# Patient Record
Sex: Male | Born: 1950 | Race: White | Hispanic: No | Marital: Married | Smoking: Current every day smoker
Health system: Southern US, Community
[De-identification: ages and names within clinical notes are randomized; demographics above are authoritative.]

## PROBLEM LIST (undated history)

## (undated) DIAGNOSIS — N189 Chronic kidney disease, unspecified: Secondary | ICD-10-CM

## (undated) DIAGNOSIS — I10 Essential (primary) hypertension: Secondary | ICD-10-CM

## (undated) DIAGNOSIS — G8929 Other chronic pain: Secondary | ICD-10-CM

---

## 2005-12-15 ENCOUNTER — Emergency Department: Payer: Self-pay | Admitting: Unknown Physician Specialty

## 2007-08-31 ENCOUNTER — Ambulatory Visit: Payer: Self-pay | Admitting: General Practice

## 2008-12-12 ENCOUNTER — Emergency Department: Payer: Self-pay | Admitting: Emergency Medicine

## 2009-03-05 DIAGNOSIS — Z0289 Encounter for other administrative examinations: Secondary | ICD-10-CM | POA: Insufficient documentation

## 2009-06-25 ENCOUNTER — Ambulatory Visit: Payer: Self-pay

## 2015-07-26 ENCOUNTER — Emergency Department
Admission: EM | Admit: 2015-07-26 | Discharge: 2015-07-26 | Disposition: A | Discharge status: Home / Self Care | Payer: Medicare Other | Attending: Emergency Medicine | Admitting: Emergency Medicine

## 2015-07-26 ENCOUNTER — Encounter: Payer: Self-pay | Admitting: Emergency Medicine

## 2015-07-26 DIAGNOSIS — F17200 Nicotine dependence, unspecified, uncomplicated: Secondary | ICD-10-CM | POA: Diagnosis not present

## 2015-07-26 DIAGNOSIS — Y92410 Unspecified street and highway as the place of occurrence of the external cause: Secondary | ICD-10-CM | POA: Insufficient documentation

## 2015-07-26 DIAGNOSIS — Y9389 Activity, other specified: Secondary | ICD-10-CM | POA: Insufficient documentation

## 2015-07-26 DIAGNOSIS — Y999 Unspecified external cause status: Secondary | ICD-10-CM | POA: Diagnosis not present

## 2015-07-26 DIAGNOSIS — S46912A Strain of unspecified muscle, fascia and tendon at shoulder and upper arm level, left arm, initial encounter: Secondary | ICD-10-CM

## 2015-07-26 DIAGNOSIS — M25512 Pain in left shoulder: Secondary | ICD-10-CM | POA: Diagnosis present
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
## 2018-02-17 ENCOUNTER — Ambulatory Visit
Admission: RE | Admit: 2018-02-17 | Discharge: 2018-02-17 | Disposition: A | Discharge status: Home / Self Care | Payer: Medicare Other | Source: Ambulatory Visit | Attending: Family Medicine | Admitting: Family Medicine

## 2018-02-17 ENCOUNTER — Other Ambulatory Visit: Payer: Self-pay | Admitting: Family Medicine

## 2018-02-17 DIAGNOSIS — M5137 Other intervertebral disc degeneration, lumbosacral region: Secondary | ICD-10-CM | POA: Diagnosis not present

## 2018-02-17 DIAGNOSIS — M545 Low back pain, unspecified: Secondary | ICD-10-CM

## 2018-02-17 DIAGNOSIS — M47817 Spondylosis without myelopathy or radiculopathy, lumbosacral region: Secondary | ICD-10-CM | POA: Diagnosis not present

## 2018-02-17 DIAGNOSIS — M1712 Unilateral primary osteoarthritis, left knee: Secondary | ICD-10-CM | POA: Diagnosis not present

## 2018-02-17 DIAGNOSIS — M5136 Other intervertebral disc degeneration, lumbar region: Secondary | ICD-10-CM | POA: Diagnosis not present

## 2018-02-17 DIAGNOSIS — M25562 Pain in left knee: Secondary | ICD-10-CM

## 2018-09-16 ENCOUNTER — Other Ambulatory Visit: Payer: Self-pay | Admitting: Pain Medicine

## 2018-09-16 DIAGNOSIS — M545 Low back pain, unspecified: Secondary | ICD-10-CM

## 2018-09-16 DIAGNOSIS — M79606 Pain in leg, unspecified: Secondary | ICD-10-CM

## 2018-09-19 ENCOUNTER — Other Ambulatory Visit: Payer: Self-pay | Admitting: Pain Medicine

## 2018-09-19 DIAGNOSIS — Z77018 Contact with and (suspected) exposure to other hazardous metals: Secondary | ICD-10-CM

## 2018-10-10 ENCOUNTER — Ambulatory Visit
Admission: RE | Admit: 2018-10-10 | Discharge: 2018-10-10 | Disposition: A | Discharge status: Home / Self Care | Payer: Medicare Other | Source: Ambulatory Visit | Attending: Pain Medicine | Admitting: Pain Medicine

## 2018-10-10 ENCOUNTER — Other Ambulatory Visit: Payer: Self-pay

## 2018-10-10 DIAGNOSIS — M545 Low back pain, unspecified: Secondary | ICD-10-CM

## 2018-10-10 DIAGNOSIS — Z77018 Contact with and (suspected) exposure to other hazardous metals: Secondary | ICD-10-CM

## 2018-10-10 DIAGNOSIS — M79606 Pain in leg, unspecified: Secondary | ICD-10-CM

## 2018-12-15 DIAGNOSIS — G894 Chronic pain syndrome: Secondary | ICD-10-CM

## 2018-12-15 DIAGNOSIS — G8929 Other chronic pain: Secondary | ICD-10-CM | POA: Diagnosis present

## 2018-12-15 DIAGNOSIS — M5430 Sciatica, unspecified side: Secondary | ICD-10-CM | POA: Insufficient documentation

## 2018-12-15 DIAGNOSIS — F419 Anxiety disorder, unspecified: Secondary | ICD-10-CM | POA: Insufficient documentation

## 2018-12-15 DIAGNOSIS — I10 Essential (primary) hypertension: Secondary | ICD-10-CM

## 2019-04-17 DIAGNOSIS — Z9181 History of falling: Secondary | ICD-10-CM | POA: Insufficient documentation

## 2019-12-07 DIAGNOSIS — M069 Rheumatoid arthritis, unspecified: Secondary | ICD-10-CM | POA: Insufficient documentation

## 2019-12-07 DIAGNOSIS — N179 Acute kidney failure, unspecified: Secondary | ICD-10-CM | POA: Insufficient documentation

## 2019-12-10 DIAGNOSIS — R0902 Hypoxemia: Secondary | ICD-10-CM | POA: Insufficient documentation

## 2020-02-12 ENCOUNTER — Inpatient Hospital Stay
Admission: EM | Admit: 2020-02-12 | Discharge: 2020-02-14 | DRG: 682 | Disposition: A | Discharge status: Hospice | Payer: Medicare Other | Attending: Internal Medicine | Admitting: Internal Medicine

## 2020-02-12 ENCOUNTER — Other Ambulatory Visit: Payer: Self-pay

## 2020-02-12 DIAGNOSIS — D539 Nutritional anemia, unspecified: Secondary | ICD-10-CM | POA: Diagnosis present

## 2020-02-12 DIAGNOSIS — F17210 Nicotine dependence, cigarettes, uncomplicated: Secondary | ICD-10-CM | POA: Diagnosis present

## 2020-02-12 DIAGNOSIS — E785 Hyperlipidemia, unspecified: Secondary | ICD-10-CM

## 2020-02-12 DIAGNOSIS — N2581 Secondary hyperparathyroidism of renal origin: Secondary | ICD-10-CM | POA: Diagnosis present

## 2020-02-12 DIAGNOSIS — Z85828 Personal history of other malignant neoplasm of skin: Secondary | ICD-10-CM | POA: Diagnosis not present

## 2020-02-12 DIAGNOSIS — F32A Depression, unspecified: Secondary | ICD-10-CM | POA: Diagnosis present

## 2020-02-12 DIAGNOSIS — Z992 Dependence on renal dialysis: Secondary | ICD-10-CM | POA: Diagnosis not present

## 2020-02-12 DIAGNOSIS — N179 Acute kidney failure, unspecified: Secondary | ICD-10-CM | POA: Diagnosis present

## 2020-02-12 DIAGNOSIS — Z20822 Contact with and (suspected) exposure to covid-19: Secondary | ICD-10-CM | POA: Diagnosis present

## 2020-02-12 DIAGNOSIS — I120 Hypertensive chronic kidney disease with stage 5 chronic kidney disease or end stage renal disease: Principal | ICD-10-CM | POA: Diagnosis present

## 2020-02-12 DIAGNOSIS — M069 Rheumatoid arthritis, unspecified: Secondary | ICD-10-CM | POA: Diagnosis present

## 2020-02-12 DIAGNOSIS — I10 Essential (primary) hypertension: Secondary | ICD-10-CM

## 2020-02-12 DIAGNOSIS — Z7982 Long term (current) use of aspirin: Secondary | ICD-10-CM

## 2020-02-12 DIAGNOSIS — J9691 Respiratory failure, unspecified with hypoxia: Secondary | ICD-10-CM | POA: Diagnosis present

## 2020-02-12 DIAGNOSIS — K922 Gastrointestinal hemorrhage, unspecified: Secondary | ICD-10-CM | POA: Diagnosis present

## 2020-02-12 DIAGNOSIS — Z79899 Other long term (current) drug therapy: Secondary | ICD-10-CM

## 2020-02-12 DIAGNOSIS — D631 Anemia in chronic kidney disease: Secondary | ICD-10-CM | POA: Diagnosis present

## 2020-02-12 DIAGNOSIS — M5430 Sciatica, unspecified side: Secondary | ICD-10-CM | POA: Diagnosis present

## 2020-02-12 DIAGNOSIS — N186 End stage renal disease: Secondary | ICD-10-CM

## 2020-02-12 DIAGNOSIS — E872 Acidosis: Secondary | ICD-10-CM | POA: Diagnosis present

## 2020-02-12 DIAGNOSIS — Z515 Encounter for palliative care: Secondary | ICD-10-CM | POA: Diagnosis not present

## 2020-02-12 DIAGNOSIS — F419 Anxiety disorder, unspecified: Secondary | ICD-10-CM | POA: Diagnosis present

## 2020-02-12 DIAGNOSIS — E7849 Other hyperlipidemia: Secondary | ICD-10-CM | POA: Diagnosis present

## 2020-02-12 DIAGNOSIS — D649 Anemia, unspecified: Secondary | ICD-10-CM | POA: Diagnosis not present

## 2020-02-12 DIAGNOSIS — N189 Chronic kidney disease, unspecified: Secondary | ICD-10-CM

## 2020-02-12 LAB — CBC
HCT: 23.7 % — ABNORMAL LOW (ref 39.0–52.0)
Hemoglobin: 7.5 g/dL — ABNORMAL LOW (ref 13.0–17.0)
MCH: 31.8 pg (ref 26.0–34.0)
MCHC: 31.6 g/dL (ref 30.0–36.0)
MCV: 100.4 fL — ABNORMAL HIGH (ref 80.0–100.0)
Platelets: 298 10*3/uL (ref 150–400)
RBC: 2.36 MIL/uL — ABNORMAL LOW (ref 4.22–5.81)
RDW: 13.7 % (ref 11.5–15.5)
WBC: 7.3 10*3/uL (ref 4.0–10.5)
nRBC: 0 % (ref 0.0–0.2)

## 2020-02-12 LAB — RESPIRATORY PANEL BY RT PCR (FLU A&B, COVID)
Influenza A by PCR: NEGATIVE
Influenza B by PCR: NEGATIVE
SARS Coronavirus 2 by RT PCR: NEGATIVE

## 2020-02-12 LAB — BASIC METABOLIC PANEL
Anion gap: 9 (ref 5–15)
BUN: 28 mg/dL — ABNORMAL HIGH (ref 8–23)
CO2: 20 mmol/L — ABNORMAL LOW (ref 22–32)
Calcium: 8.9 mg/dL (ref 8.9–10.3)
Chloride: 108 mmol/L (ref 98–111)
Creatinine, Ser: 3.53 mg/dL — ABNORMAL HIGH (ref 0.61–1.24)
GFR, Estimated: 18 mL/min — ABNORMAL LOW (ref >60)
Glucose, Bld: 138 mg/dL — ABNORMAL HIGH (ref 70–99)
Potassium: 4.6 mmol/L (ref 3.5–5.1)
Sodium: 137 mmol/L (ref 135–145)

## 2020-02-12 LAB — PREPARE RBC (CROSSMATCH)

## 2020-02-12 LAB — ABO/RH: ABO/RH(D): A NEG

## 2020-02-12 MED ORDER — TRAZODONE HCL 50 MG PO TABS: 25.0000 mg | ORAL_TABLET | Freq: Every evening | ORAL | Status: DC | PRN

## 2020-02-12 MED ORDER — ONDANSETRON HCL 4 MG PO TABS: 4.0000 mg | ORAL_TABLET | Freq: Four times a day (QID) | ORAL | Status: DC | PRN

## 2020-02-12 MED ORDER — ENOXAPARIN SODIUM 40 MG/0.4ML ~~LOC~~ SOLN: 40.0000 mg | SUBCUTANEOUS | Status: DC

## 2020-02-12 MED ORDER — SODIUM CHLORIDE 0.9 % IV SOLN: INTRAVENOUS | Status: DC

## 2020-02-12 MED ORDER — SODIUM CHLORIDE 0.9 % IV SOLN
10.0000 mL/h | Freq: Once | INTRAVENOUS | Status: AC
  Administered 2020-02-12: 10 mL/h via INTRAVENOUS

## 2020-02-12 MED ORDER — MORPHINE SULFATE (PF) 2 MG/ML IV SOLN
2.0000 mg | INTRAVENOUS | Status: DC | PRN
  Administered 2020-02-13: 04:00:00 2 mg via INTRAVENOUS
  Filled 2020-02-12: qty 1

## 2020-02-12 MED ORDER — ONDANSETRON HCL 4 MG/2ML IJ SOLN: 4.0000 mg | Freq: Four times a day (QID) | INTRAMUSCULAR | Status: DC | PRN

## 2020-02-12 MED ORDER — SODIUM CHLORIDE 0.9 % IV SOLN: 2.0000 g | Freq: Once | INTRAVENOUS | Status: DC

## 2020-02-12 MED ORDER — MAGNESIUM HYDROXIDE 400 MG/5ML PO SUSP
30.0000 mL | Freq: Every day | ORAL | Status: DC | PRN
  Filled 2020-02-12: qty 30

## 2020-02-12 MED ORDER — VANCOMYCIN HCL IN DEXTROSE 1-5 GM/200ML-% IV SOLN: 1000.0000 mg | Freq: Once | INTRAVENOUS | Status: DC

## 2020-02-12 NOTE — ED Provider Notes (Signed)
[facility] Emergency Department Provider Note   ____________________________________________   First MD Initiated Contact with Patient 02/12/20 2000     (approximate)  I have reviewed the triage vital signs and the nursing notes.   HISTORY  Chief Complaint Low Hgb   HPI Jerry Galloway is a 69 y.o. male who was sent here by his doctor because his blood count was low.  In September patient was in [facility] getting dialysis.  He was told by [facility] he would live for 2 weeks.  He is still going strong.  His GFR is 18.  His electrolytes are okay.  His hemoglobin however 7.5.  Patient denies having anything wrong with his rectum and denies have any black tarry stools or blood in his stools but his daughter says that the people do his laundry every repeatedly seen blood in his underwear in the laundry.         History reviewed. No pertinent past medical history. Past medical history is positive for renal failure which was acute in September He also had hypoxic respiratory failure. He also appears to have rectal bleeding. There are no problems to display for this patient.   History reviewed. No pertinent surgical history.  Prior to Admission medications   Not on File    Allergies Patient has no known allergies.  No family history on file.  Social History Social History   Tobacco Use  . Smoking status: Current Every Day Smoker  . Smokeless tobacco: Never Used  Substance Use Topics  . Alcohol use: Never  . Drug use: Never    Review of Systems  Constitutional: No fever/chills Eyes: No visual changes. ENT: No sore throat. Cardiovascular: Denies chest pain. Respiratory: Denies shortness of breath. Gastrointestinal: No abdominal pain.  No nausea, no vomiting.  No diarrhea.  No constipation. Genitourinary: Negative for dysuria. Musculoskeletal: Negative for back pain. Skin: Negative for rash. Neurological: Negative for headaches, focal weakness    ____________________________________________   PHYSICAL EXAM:  VITAL SIGNS: ED Triage Vitals  Enc Vitals Group     BP 02/12/20 1520 (!) 131/49     Pulse Rate 02/12/20 1520 79     Resp 02/12/20 1520 18     Temp 02/12/20 1520 98 F (36.7 C)     Temp Source 02/12/20 1955 Oral     SpO2 02/12/20 1520 100 %     Weight 02/12/20 1518 160 lb (72.6 kg)     Height 02/12/20 1518 6' (1.829 m)     Head Circumference --      Peak Flow --      Pain Score 02/12/20 1517 0     Pain Loc --      Pain Edu? --      Excl. in [place]? --     Constitutional: Alert and oriented. Well appearing and in no acute distress. Eyes: Conjunctivae are normal. PER Head: Atraumatic. Nose: No congestion/rhinnorhea. Mouth/Throat: Mucous membranes are moist.  Oropharynx non-erythematous. Neck: No stridor.   Cardiovascular: Normal rate, regular rhythm. Grossly normal heart sounds.  Good peripheral circulation. Respiratory: Normal respiratory effort.  No retractions. Lungs CTAB. Gastrointestinal: Soft and nontender. No distention. No abdominal bruits. No CVA tenderness. Rectal: Patient refused Musculoskeletal: No lower extremity tenderness bilateral 1+ edema.   Neurologic:  Normal speech and language. No gross focal neurologic deficits are appreciated. Skin:  Skin is warm, dry and intact. No rash noted.  ____________________________________________   LABS (all labs ordered are listed, but only abnormal  results are displayed)  Labs Reviewed  CBC - Abnormal; Notable for the following components:      Result Value   RBC 2.36 (*)    Hemoglobin 7.5 (*)    HCT 23.7 (*)    MCV 100.4 (*)    All other components within normal limits  BASIC METABOLIC PANEL - Abnormal; Notable for the following components:   CO2 20 (*)    Glucose, Bld 138 (*)    BUN 28 (*)    Creatinine, Ser 3.53 (*)    GFR, Estimated 18 (*)    All other components within normal limits  TYPE AND SCREEN  PREPARE RBC (CROSSMATCH)    ____________________________________________  EKG   ____________________________________________  RADIOLOGY Gertha Calkin, personally viewed and evaluated these images (plain radiographs) as part of my medical decision making, as well as reviewing the written report by the radiologist.  ED MD interpretation:    Official radiology report(s): No results found.  ____________________________________________   PROCEDURES  Procedure(s) performed (including Critical Care):  Procedures   ____________________________________________   INITIAL IMPRESSION / ASSESSMENT AND PLAN / ED COURSE               ____________________________________________   FINAL CLINICAL IMPRESSION(S) / ED DIAGNOSES  Final diagnoses:  Symptomatic anemia  Gastrointestinal hemorrhage, unspecified gastrointestinal hemorrhage type  Chronic renal failure, unspecified CKD stage     ED Discharge Orders    None      *Please note:  Jerry Galloway was evaluated in Emergency Department on 02/12/2020 for the symptoms described in the history of present illness. He was evaluated in the context of the global COVID-19 pandemic, which necessitated consideration that the patient might be at risk for infection with the SARS-CoV-2 virus that causes COVID-19. Institutional protocols and algorithms that pertain to the evaluation of patients at risk for COVID-19 are in a state of rapid change based on information released by regulatory bodies including the CDC and federal and state organizations. These policies and algorithms were followed during the patient's care in the ED.  Some ED evaluations and interventions may be delayed as a result of limited staffing during and the pandemic.*   Note:  This document was prepared using Dragon voice recognition software and may include unintentional dictation errors.    Nena Polio, MD 02/12/20 [phone]
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
## 2020-02-12 NOTE — ED Triage Notes (Signed)
Pt comes via POV from MD with c/o needing blood transfusion. Pt states no issues and says he was sent by doctor to get a transfusion or get his blood checked.  Pt denies any pain. Pt denies any CP or SOB.

## 2020-02-13 DIAGNOSIS — E7849 Other hyperlipidemia: Secondary | ICD-10-CM

## 2020-02-13 DIAGNOSIS — D649 Anemia, unspecified: Secondary | ICD-10-CM

## 2020-02-13 DIAGNOSIS — N186 End stage renal disease: Secondary | ICD-10-CM

## 2020-02-13 DIAGNOSIS — E785 Hyperlipidemia, unspecified: Secondary | ICD-10-CM

## 2020-02-13 LAB — BPAM RBC
Blood Product Expiration Date: 202111252359
ISSUE DATE / TIME: 202111082212
Unit Type and Rh: 600

## 2020-02-13 LAB — BASIC METABOLIC PANEL
Anion gap: 9 (ref 5–15)
BUN: 23 mg/dL (ref 8–23)
CO2: 18 mmol/L — ABNORMAL LOW (ref 22–32)
Calcium: 8.5 mg/dL — ABNORMAL LOW (ref 8.9–10.3)
Chloride: 111 mmol/L (ref 98–111)
Creatinine, Ser: 3.11 mg/dL — ABNORMAL HIGH (ref 0.61–1.24)
GFR, Estimated: 21 mL/min — ABNORMAL LOW (ref >60)
Glucose, Bld: 115 mg/dL — ABNORMAL HIGH (ref 70–99)
Potassium: 4.3 mmol/L (ref 3.5–5.1)
Sodium: 138 mmol/L (ref 135–145)

## 2020-02-13 LAB — TYPE AND SCREEN
ABO/RH(D): A NEG
Antibody Screen: NEGATIVE
Unit division: 0

## 2020-02-13 LAB — HEMOGLOBIN AND HEMATOCRIT, BLOOD
HCT: 25.3 % — ABNORMAL LOW (ref 39.0–52.0)
HCT: 25.6 % — ABNORMAL LOW (ref 39.0–52.0)
HCT: 27.2 % — ABNORMAL LOW (ref 39.0–52.0)
Hemoglobin: 8.2 g/dL — ABNORMAL LOW (ref 13.0–17.0)
Hemoglobin: 8.4 g/dL — ABNORMAL LOW (ref 13.0–17.0)
Hemoglobin: 8.9 g/dL — ABNORMAL LOW (ref 13.0–17.0)

## 2020-02-13 LAB — CBC
HCT: 26.2 % — ABNORMAL LOW (ref 39.0–52.0)
Hemoglobin: 8.3 g/dL — ABNORMAL LOW (ref 13.0–17.0)
MCH: 30.6 pg (ref 26.0–34.0)
MCHC: 31.7 g/dL (ref 30.0–36.0)
MCV: 96.7 fL (ref 80.0–100.0)
Platelets: 279 10*3/uL (ref 150–400)
RBC: 2.71 MIL/uL — ABNORMAL LOW (ref 4.22–5.81)
RDW: 14.7 % (ref 11.5–15.5)
WBC: 7.8 10*3/uL (ref 4.0–10.5)
nRBC: 0 % (ref 0.0–0.2)

## 2020-02-13 LAB — IRON AND TIBC
Iron: 50 ug/dL (ref 45–182)
Saturation Ratios: 18 % (ref 17.9–39.5)
TIBC: 272 ug/dL (ref 250–450)
UIBC: 222 ug/dL

## 2020-02-13 LAB — VITAMIN B12: Vitamin B-12: 184 pg/mL (ref 180–914)

## 2020-02-13 LAB — FERRITIN: Ferritin: 212 ng/mL (ref 24–336)

## 2020-02-13 MED ORDER — NICOTINE 21 MG/24HR TD PT24
21.0000 mg | MEDICATED_PATCH | Freq: Every day | TRANSDERMAL | Status: DC
  Administered 2020-02-13: 21:00:00 21 mg via TRANSDERMAL
  Filled 2020-02-13: qty 1

## 2020-02-13 MED ORDER — OXYCODONE-ACETAMINOPHEN 7.5-325 MG PO TABS
2.0000 | ORAL_TABLET | Freq: Four times a day (QID) | ORAL | Status: DC | PRN
  Administered 2020-02-13 – 2020-02-14 (×3): 2 via ORAL
  Filled 2020-02-13 (×3): qty 2

## 2020-02-13 MED ORDER — CALCITRIOL 0.25 MCG PO CAPS
0.2500 ug | ORAL_CAPSULE | Freq: Every day | ORAL | Status: DC
  Administered 2020-02-14: 09:00:00 0.25 ug via ORAL
  Filled 2020-02-13: qty 1

## 2020-02-13 MED ORDER — LABETALOL HCL 5 MG/ML IV SOLN
10.0000 mg | Freq: Three times a day (TID) | INTRAVENOUS | Status: DC | PRN
  Administered 2020-02-13: 15:00:00 10 mg via INTRAVENOUS
  Filled 2020-02-13: qty 4

## 2020-02-13 MED ORDER — LORAZEPAM 0.5 MG PO TABS
0.5000 mg | ORAL_TABLET | Freq: Two times a day (BID) | ORAL | Status: DC | PRN
  Administered 2020-02-13: 0.5 mg via ORAL
  Filled 2020-02-13: qty 1

## 2020-02-13 MED ORDER — ROSUVASTATIN CALCIUM 20 MG PO TABS
40.0000 mg | ORAL_TABLET | Freq: Every day | ORAL | Status: DC
  Administered 2020-02-13 – 2020-02-14 (×2): 40 mg via ORAL
  Filled 2020-02-13 (×2): qty 2

## 2020-02-13 MED ORDER — SODIUM BICARBONATE 650 MG PO TABS
1300.0000 mg | ORAL_TABLET | Freq: Two times a day (BID) | ORAL | Status: DC
  Administered 2020-02-13 – 2020-02-14 (×2): 1300 mg via ORAL
  Filled 2020-02-13 (×3): qty 2

## 2020-02-13 NOTE — Progress Notes (Signed)
PT Cancellation Note  Patient Details Name: Jerry Galloway MRN: [id number] DOB: 02/24/1951   Cancelled Treatment:    Reason Eval/Treat Not Completed: PT screened, no needs identified, will sign off. Patient ambulated without AD and independently with OT this morning. Patient and wife report he gets around fine. No needs at this time. Will sign off.     Jemal Miskell 02/13/2020, 3:01 PM

## 2020-02-13 NOTE — Evaluation (Signed)
Occupational Therapy Evaluation Patient Details Name: Jerry Galloway MRN: [id number] DOB: Dec 21, 1950 Today's Date: 02/13/2020    History of Present Illness Jerry Galloway  is a 69 y.o. male with a known history of hypertension, rheumatoid arthritis, tobacco abuse, and anxiety and depression, who presented to the emergency room with acute onset of anemia being sent in by his primary care physician.   Clinical Impression   Jerry Galloway was seen for OT evaluation this date. Prior to hospital admission, pt was Independent in ADLs and mobility. Pt lives c wife in mobile home c 2-3 STE. Pt presents to acute OT demonstrating baseline Independence with ADL performance and functional mobility. Pt currently requires SUPERVISION only for ~180 ft mobility w/o AD; vitals stable t/o. Pt is HOH and requires increased cues for directions in hallway. Pt has no skilled acute OT needs identified. Will sign off at this time. Please re-consult if new needs arise. Upon hospital discharge, anticipate no OT follow up needed.      Follow Up Recommendations  No OT follow up    Equipment Recommendations  None recommended by OT    Recommendations for Other Services       Precautions / Restrictions Precautions Precautions: None Restrictions Weight Bearing Restrictions: No      Mobility Bed Mobility Overal bed mobility: Independent       Transfers Overall transfer level: Independent         Balance Overall balance assessment: Mild deficits observed, not formally tested       ADL either performed or assessed with clinical judgement   ADL Overall ADL's : Independent                       Pertinent Vitals/Pain Pain Assessment: No/denies pain     Hand Dominance     Extremity/Trunk Assessment Upper Extremity Assessment Upper Extremity Assessment: Overall WFL for tasks assessed   Lower Extremity Assessment Lower Extremity Assessment: Overall WFL for tasks assessed        Communication Communication Communication: HOH   Cognition Arousal/Alertness: Awake/alert Behavior During Therapy: Restless Overall Cognitive Status: Difficult to assess      General Comments       Exercises Exercises: Other exercises Other Exercises Other Exercises: Pt educated re: OT role, DME recs, falls prevention Other Exercises: sit<>stand, sitting/standing balance/tolerance, ~180 ft mobility   Shoulder Instructions      Home Living Family/patient expects to be discharged to:: Private residence Living Arrangements: Spouse/significant other Available Help at Discharge: Family Type of Home: Mobile home Home Access: Stairs to enter Entrance Stairs-Number of Steps: 2-3 Entrance Stairs-Rails:  (endorses 1 rail, does not answer L vs R) Home Layout: One level               Home Equipment: Walker - 2 wheels;Cane - single point          Prior Functioning/Environment Level of Independence: Independent        Comments: Pt reports Independent mobility and ADLs        OT Problem List: Decreased safety awareness      OT Treatment/Interventions:      OT Goals(Current goals can be found in the care plan section) Acute Rehab OT Goals Patient Stated Goal: to go home OT Goal Formulation: With patient Time For Goal Achievement: 02/27/20 Potential to Achieve Goals: Good  OT Frequency:      AM-PAC OT "6 Clicks" Daily Activity     Outcome Measure Help from another  person eating meals?: None Help from another person taking care of personal grooming?: None Help from another person toileting, which includes using toliet, bedpan, or urinal?: None Help from another person bathing (including washing, rinsing, drying)?: None Help from another person to put on and taking off regular upper body clothing?: None Help from another person to put on and taking off regular lower body clothing?: None 6 Click Score: 24   End of Session Nurse Communication: Mobility  status  Activity Tolerance: Patient tolerated treatment well Patient left: in bed;with call bell/phone within reach;with bed alarm set  OT Visit Diagnosis: Other abnormalities of gait and mobility (R26.89)                Time: 3794-3276 OT Time Calculation (min): 15 min Charges:  OT General Charges $OT Visit: 1 Visit OT Evaluation $OT Eval Low Complexity: 1 Low OT Treatments $Therapeutic Activity: 8-22 mins  Dessie Coma, M.S. OTR/L  02/13/20, 11:44 AM  ascom [phone]

## 2020-02-13 NOTE — Progress Notes (Signed)
Daily update given to daughter, states that she wants to speak with MD, MD made aware

## 2020-02-13 NOTE — Consult Note (Signed)
[facility] CONSULT NOTE    Date: 02/13/2020                  Patient Name:  Jerry Galloway  MRN: [id number]  DOB: 07-Jan-1951  Age / Sex: 69 y.o., male         PCP: Sherald Hess., MD                 Service Requesting Consult: Hospitalist                 Reason for Consult: ARF             History of Present Illness: Patient is a 69 y.o. male with  HTN, RA, ARF recently treated at [facility] hospital who was admitted to [facility] on 02/12/2020 for evaluation of anemia.   Medications: Outpatient medications: Medications Prior to Admission  Medication Sig Dispense Refill Last Dose  . aspirin 81 MG EC tablet Take 1 tablet by mouth daily.   02/11/2020 at 0800  . buprenorphine (BUTRANS) 15 MCG/HR 1 patch once a week.   Unknown at Unknown  . ENDOCET 7.5-325 MG tablet Take 1-2 tablets by mouth 4 (four) times daily as needed.   prn at prn  . hydrochlorothiazide (HYDRODIURIL) 25 MG tablet Take 25 mg by mouth daily.   02/11/2020 at Unknown time  . lisinopril (ZESTRIL) 40 MG tablet Take 40 mg by mouth daily.   02/11/2020 at Unknown time  . LORazepam (ATIVAN) 0.5 MG tablet Take 1-2 tablets by mouth at bedtime.    02/11/2020 at Unknown time  . nitroGLYCERIN (NITROSTAT) 0.4 MG SL tablet Place 0.4 mg under the tongue every 5 (five) minutes as needed.    prn at prn  . ondansetron (ZOFRAN-ODT) 8 MG disintegrating tablet Take 8 mg by mouth every 6 (six) hours as needed.    prn at prn  . prochlorperazine (COMPAZINE) 5 MG tablet Take 5 mg by mouth every 6 (six) hours as needed.    prn at prn  . rosuvastatin (CRESTOR) 40 MG tablet Take 40 mg by mouth daily.   02/12/2020 at 0800  . Vitamin D, Ergocalciferol, (DRISDOL) 1.25 MG (50000 UNIT) CAPS capsule Take 50,000 Units by mouth once a week.   Unknown at Unknown  . XTAMPZA ER 13.5 MG C12A Take 1 capsule by mouth 2 (two) times daily.   02/11/2020 at Unknown time    Current medications: Current Facility-Administered Medications   Medication Dose Route Frequency Provider Last Rate Last Admin  . 0.9 %  sodium chloride infusion   Intravenous Continuous Wyvonnia Dusky, MD 75 mL/hr at 02/13/20 1321 Rate Change at 02/13/20 1321  . calcitRIOL (ROCALTROL) capsule 0.25 mcg  0.25 mcg Oral Daily Naleigha Raimondi, MD      . labetalol (NORMODYNE) injection 10 mg  10 mg Intravenous Q8H PRN Wyvonnia Dusky, MD   10 mg at 02/13/20 1457  . LORazepam (ATIVAN) tablet 0.5 mg  0.5 mg Oral BID PRN Wyvonnia Dusky, MD      . magnesium hydroxide (MILK OF MAGNESIA) suspension 30 mL  30 mL Oral Daily PRN Mansy, Jan A, MD      . morphine 2 MG/ML injection 2 mg  2 mg Intravenous Q4H PRN Mansy, Jan A, MD   2 mg at 02/13/20 0334  . ondansetron (ZOFRAN) tablet 4 mg  4 mg Oral Q6H PRN Mansy, Arvella Merles, MD       Or  . ondansetron (  ZOFRAN) injection 4 mg  4 mg Intravenous Q6H PRN Mansy, Jan A, MD      . oxyCODONE-acetaminophen (PERCOCET) 7.5-325 MG per tablet 2 tablet  2 tablet Oral Q6H PRN Wyvonnia Dusky, MD   2 tablet at 02/13/20 1447  . rosuvastatin (CRESTOR) tablet 40 mg  40 mg Oral Daily Wyvonnia Dusky, MD   40 mg at 02/13/20 1326      Allergies: No Known Allergies    Past Medical History: History reviewed. No pertinent past medical history.   Past Surgical History: History reviewed. No pertinent surgical history.   Family History: No family history on file.   Social History: Social History   Socioeconomic History  . Marital status: Married    Spouse name: Not on file  . Number of children: Not on file  . Years of education: Not on file  . Highest education level: Not on file  Occupational History  . Not on file  Tobacco Use  . Smoking status: Current Every Day Smoker  . Smokeless tobacco: Never Used  Substance and Sexual Activity  . Alcohol use: Never  . Drug use: Never  . Sexual activity: Not on file  Other Topics Concern  . Not on file  Social History Narrative  . Not on file   Social  Determinants of Health   Financial Resource Strain:   . Difficulty of Paying Living Expenses: Not on file  Food Insecurity:   . Worried About Charity fundraiser in the Last Year: Not on file  . Ran Out of Food in the Last Year: Not on file  Transportation Needs:   . Lack of Transportation (Medical): Not on file  . Lack of Transportation (Non-Medical): Not on file  Physical Activity:   . Days of Exercise per Week: Not on file  . Minutes of Exercise per Session: Not on file  Stress:   . Feeling of Stress : Not on file  Social Connections:   . Frequency of Communication with Friends and Family: Not on file  . Frequency of Social Gatherings with Friends and Family: Not on file  . Attends Religious Services: Not on file  . Active Member of Clubs or Organizations: Not on file  . Attends Archivist Meetings: Not on file  . Marital Status: Not on file  Intimate Partner Violence:   . Fear of Current or Ex-Partner: Not on file  . Emotionally Abused: Not on file  . Physically Abused: Not on file  . Sexually Abused: Not on file   Gen: + Malaise, fatigue HEENT: very hard of hearing CV: No chest pain or shortness of breath Resp: No cough or sputum production GI: No nausea, vomiting or diarrhea.  No blood in the stool GU : No problems with voiding.  No hematuria.    MS: + reports "pulled muscle" Derm:   No complaints Psych: No complaints Heme: No complaints Neuro: No complaints Endocrine: No complaints    Vital Signs: Blood pressure (!) 180/73, pulse 82, temperature 98.3 F (36.8 C), resp. rate 16, height 6' (1.829 m), weight 72.6 kg, SpO2 98 %.  Weight trends: Filed Weights   02/12/20 1518  Weight: 72.6 kg    Physical Exam: Physical Exam: General:  No acute distress,resting in bed  Head:  Normocephalic, atraumatic. Moist oral mucosal membranes  Eyes:  Anicteric  Lungs:   Clear to auscultation, normal effort  Heart:  S1S2 no rubs or gallops  Abdomen:   Soft,  nontender  Extremities:  No peripheral edema.  Neurologic:  Awake, alert, hard of hearing,speech clear and appropriate   Skin:  No lesions or rashes    Lab results: Basic Metabolic Panel: Recent Labs  Lab 02/12/20 1522 02/13/20 0405  NA 137 138  K 4.6 4.3  CL 108 111  CO2 20* 18*  GLUCOSE 138* 115*  BUN 28* 23  CREATININE 3.53* 3.11*  CALCIUM 8.9 8.5*    Liver Function Tests: No results for input(s): AST, ALT, ALKPHOS, BILITOT, PROT, ALBUMIN in the last 168 hours. No results for input(s): LIPASE, AMYLASE in the last 168 hours. No results for input(s): AMMONIA in the last 168 hours.  CBC: Recent Labs  Lab 02/12/20 1522 02/13/20 0405 02/13/20 0924  WBC 7.3 7.8  --   HGB 7.5* 8.3* 8.9*  HCT 23.7* 26.2* 27.2*  MCV 100.4* 96.7  --   PLT 298 279  --     Cardiac Enzymes: No results for input(s): CKTOTAL, CKMB, CKMBINDEX, TROPONINI in the last 168 hours.  BNP: Invalid input(s): POCBNP  CBG: No results for input(s): GLUCAP in the last 168 hours.  Microbiology: Results for orders placed or performed during the hospital encounter of 02/12/20  Respiratory Panel by RT PCR (Flu A&B, Covid) - Nasopharyngeal Swab     Status: None   Collection Time: 02/12/20  9:09 PM   Specimen: Nasopharyngeal Swab  Result Value Ref Range Status   SARS Coronavirus 2 by RT PCR NEGATIVE NEGATIVE Final    Comment: (NOTE) SARS-CoV-2 target nucleic acids are NOT DETECTED.  The SARS-CoV-2 RNA is generally detectable in upper respiratoy specimens during the acute phase of infection. The lowest concentration of SARS-CoV-2 viral copies this assay can detect is 131 copies/mL. A negative result does not preclude SARS-Cov-2 infection and should not be used as the sole basis for treatment or other patient management decisions. A negative result may occur with  improper specimen collection/handling, submission of specimen other than nasopharyngeal swab, presence of viral mutation(s) within  the areas targeted by this assay, and inadequate number of viral copies (<131 copies/mL). A negative result must be combined with clinical observations, patient history, and epidemiological information. The expected result is Negative.  Fact Sheet for Patients:  PinkCheek.be  Fact Sheet for Healthcare Providers:  GravelBags.it  This test is no t yet approved or cleared by the [place] FDA and  has been authorized for detection and/or diagnosis of SARS-CoV-2 by FDA under an Emergency Use Authorization (EUA). This EUA will remain  in effect (meaning this test can be used) for the duration of the COVID-19 declaration under Section 564(b)(1) of the Act, 21 U.S.C. section 360bbb-3(b)(1), unless the authorization is terminated or revoked sooner.     Influenza A by PCR NEGATIVE NEGATIVE Final   Influenza B by PCR NEGATIVE NEGATIVE Final    Comment: (NOTE) The Xpert Xpress SARS-CoV-2/FLU/RSV assay is intended as an aid in  the diagnosis of influenza from Nasopharyngeal swab specimens and  should not be used as a sole basis for treatment. Nasal washings and  aspirates are unacceptable for Xpert Xpress SARS-CoV-2/FLU/RSV  testing.  Fact Sheet for Patients: PinkCheek.be  Fact Sheet for Healthcare Providers: GravelBags.it  This test is not yet approved or cleared by the [place] FDA and  has been authorized for detection and/or diagnosis of SARS-CoV-2 by  FDA under an Emergency Use Authorization (EUA). This EUA will remain  in effect (meaning this test can be used) for the duration of the  Covid-19 declaration under Section 564(b)(1) of the Act, 21  U.S.C. section 360bbb-3(b)(1), unless the authorization is  terminated or revoked. Performed at [facility], [address] [place], [place] [address]     Coagulation Studies: No results for input(s):  LABPROT, INR in the last 72 hours.  Urinalysis: No results for input(s): COLORURINE, LABSPEC, PHURINE, GLUCOSEU, HGBUR, BILIRUBINUR, KETONESUR, PROTEINUR, UROBILINOGEN, NITRITE, LEUKOCYTESUR in the last 72 hours.  Invalid input(s): APPERANCEUR    Imaging: No results found.  EXAM: US RENAL COMPLETE  DATE: 12/07/2019 2:26 PM  ACCESSION: [id number]  DICTATED: 12/07/2019 2:28 PM  INTERPRETATION LOCATION: [place]   CLINICAL INDICATION: 69 years old Male with acute renal failure    COMPARISON: None.   TECHNIQUE: Static andcine images of the kidneys and bladder were performed.   FINDINGS:   KIDNEYS: The kidneys are normal in size with increased echogenicity bilaterally. No solid masses or calculi. No hydronephrosis. Right renal cyst is better appreciated on same-day CT.        Assessment/ Plan:  69 y.o. male with  HTN, Rheumatoid Arthritis,ARF recently treated at [facility], was admitted to [facility] on 02/12/2020 with anemia requiring blood transfusions was admitted on 02/12/2020 for anemia requiring PRBC transfusions. He has h/o HTN , RA and recent ARF treated at [facility].  Active Problems:   'light-for-dates' infant with signs of fetal malnutrition   Symptomatic anemia   ESRD (end stage renal disease) ([place])   Other hyperlipidemia  'light-for-dates' infant with signs of fetal malnutrition [P05.00] Symptomatic anemia [D64.9] Gastrointestinal hemorrhage, unspecified gastrointestinal hemorrhage type [K92.2] Chronic renal failure, unspecified CKD stage [N18.9]  #. Acute Renal failure  Patient was admitted to [facility] with acute renal failure in September,Renal function improved since then. Baseline creatinine  1.5 (jan 2021) per [facility] notes  Renal imaging as above - no hydronephrosis Right renal cyst noted U/A: not available. Will order Work up at [facility] 12/16/2019: ANCA positive, MPO and PR3 neg; Anti GBM neg, ANA neg, ENA neg,  PLA2R ab neg HIV neg SPEP neg [facility] U/A = +  protein, gluc, blood No iv contrast exposure NSAID ?  Renal labs on admission to [facility] in September 2021 Creatinine 13 and K+ 6.5 Patient underwent 7 dialysis sessions Refused further dialysis and discharged from [facility] on 12/16/2019 with home hospice  Renal function today Creatinine 3.11 BUN 23 GFR 21 Renal function improved since last admission to [facility] No acute indication for dialysis We can follow up as outpatient   Plan: Obtain U/A Electrolytes and Volume status are acceptable No acute indication for Dialysis at present   #. Anemia of CKD  Lab Results  Component Value Date   HGB 8.9 (L) 02/13/2020  GI consult in process  Received PRBC transfusions   #Hypertension Blood pressure readings above goal Patient is on Labetalol as needed  # Secondary hyperparathyroidism of renal origin N 25.81  PTH 658 on 12/08/2019 Add calcitriol   # metabolic acidosis secondary to CKD Add Sodium bicarbonate supplentation     LOS: [place] 11/9/20213:58 PM  [place], [place]

## 2020-02-13 NOTE — Progress Notes (Signed)
PROGRESS NOTE    Jerry Galloway  PZW:[id number] DOB: [birth year] DOA: 02/12/2020 PCP: Sherald Hess., MD   Assessment & Plan:   Active Problems:   'light-for-dates' infant with signs of fetal malnutrition   Symptomatic anemia   Symptomatic anemia: likely blood loss due to GI bleeding. S/p 2 units pRBCs transfused. Will continue to monitor H&H. Continue on PPI. GI consulted   ESRD: previously on HD but pt did not want to live on HD. Nephro consulted. Evidently palliative care/hospice was seeing the pt prior.  Anxiety: severity unknown. Continue on home dose of ativan prn   Hypertension: continue to hold HCTZ, lisinopril. IV labetalol prn   HLD: continue on statin   Tobacco abuse: nicotine patch to prevent w/drawal. Smoking cessation counseling    DVT prophylaxis: SCDs Code Status: full Family Communication: discussed pt's care w/ pt's daughter, Joelene Millin and answered her questions  Disposition Plan: depends on what care pt wants to receive and what he does not. Likely d/c back home   Status is: Inpatient  Remains inpatient appropriate because:Ongoing diagnostic testing needed not appropriate for outpatient work up, Unsafe d/c plan and IV treatments appropriate due to intensity of illness or inability to take PO   Dispo: The patient is from: Home              Anticipated d/c is to: Home              Anticipated d/c date is: 3 days              Patient currently is not medically stable to d/c.    Consultants:   nephro   GI   Procedures:  Antimicrobials:   Subjective: Pt c/o malaise   Objective: Vitals:   02/12/20 2132 02/12/20 2223 02/13/20 0005 02/13/20 0518  BP: (!) 155/78 (!) 159/57 (!) 155/61 (!) 164/77  Pulse: 89 85 69 91  Resp: 17 17 17 17   Temp: 97.9 F (36.6 C) 98.1 F (36.7 C) 98.2 F (36.8 C) 98.4 F (36.9 C)  TempSrc: Oral Oral Oral   SpO2: 97% 100% 98% 99%  Weight:      Height:        Intake/Output Summary (Last 24 hours) at  02/13/2020 4235 Last data filed at 02/13/2020 0023 Gross per 24 hour  Intake 1700 ml  Output --  Net 1700 ml   Filed Weights   02/12/20 1518  Weight: 72.6 kg    Examination:  General exam: Appears calm and comfortable  Respiratory system: Clear to auscultation. Respiratory effort normal. Cardiovascular system: S1 & S2 +. No  rubs, gallops or clicks.  Gastrointestinal system: Abdomen is nondistended, soft and nontender.  Normal bowel sounds heard. Central nervous system: Alert and oriented. Moves all 4 extremities Psychiatry: Judgement and insight appear normal.    Data Reviewed: I have personally reviewed following labs and imaging studies  CBC: Recent Labs  Lab 02/12/20 0028 02/12/20 1522 02/13/20 0405  WBC  --  7.3 7.8  HGB 8.2* 7.5* 8.3*  HCT 25.3* 23.7* 26.2*  MCV  --  100.4* 96.7  PLT  --  298 361   Basic Metabolic Panel: Recent Labs  Lab 02/12/20 1522 02/13/20 0405  NA 137 138  K 4.6 4.3  CL 108 111  CO2 20* 18*  GLUCOSE 138* 115*  BUN 28* 23  CREATININE 3.53* 3.11*  CALCIUM 8.9 8.5*   GFR: Estimated Creatinine Clearance: 22.4 mL/min (A) (by C-G formula based on SCr  of 3.11 mg/dL (H)). Liver Function Tests: No results for input(s): AST, ALT, ALKPHOS, BILITOT, PROT, ALBUMIN in the last 168 hours. No results for input(s): LIPASE, AMYLASE in the last 168 hours. No results for input(s): AMMONIA in the last 168 hours. Coagulation Profile: No results for input(s): INR, PROTIME in the last 168 hours. Cardiac Enzymes: No results for input(s): CKTOTAL, CKMB, CKMBINDEX, TROPONINI in the last 168 hours. BNP (last 3 results) No results for input(s): PROBNP in the last 8760 hours. HbA1C: No results for input(s): HGBA1C in the last 72 hours. CBG: No results for input(s): GLUCAP in the last 168 hours. Lipid Profile: No results for input(s): CHOL, HDL, LDLCALC, TRIG, CHOLHDL, LDLDIRECT in the last 72 hours. Thyroid Function Tests: No results for input(s):  TSH, T4TOTAL, FREET4, T3FREE, THYROIDAB in the last 72 hours. Anemia Panel: Recent Labs    02/12/20 0028  FERRITIN 212  TIBC 272  IRON 50   Sepsis Labs: No results for input(s): PROCALCITON, LATICACIDVEN in the last 168 hours.  Recent Results (from the past 240 hour(s))  Respiratory Panel by RT PCR (Flu A&B, Covid) - Nasopharyngeal Swab     Status: None   Collection Time: 02/12/20  9:09 PM   Specimen: Nasopharyngeal Swab  Result Value Ref Range Status   SARS Coronavirus 2 by RT PCR NEGATIVE NEGATIVE Final    Comment: (NOTE) SARS-CoV-2 target nucleic acids are NOT DETECTED.  The SARS-CoV-2 RNA is generally detectable in upper respiratoy specimens during the acute phase of infection. The lowest concentration of SARS-CoV-2 viral copies this assay can detect is 131 copies/mL. A negative result does not preclude SARS-Cov-2 infection and should not be used as the sole basis for treatment or other patient management decisions. A negative result may occur with  improper specimen collection/handling, submission of specimen other than nasopharyngeal swab, presence of viral mutation(s) within the areas targeted by this assay, and inadequate number of viral copies (<131 copies/mL). A negative result must be combined with clinical observations, patient history, and epidemiological information. The expected result is Negative.  Fact Sheet for Patients:  PinkCheek.be  Fact Sheet for Healthcare Providers:  GravelBags.it  This test is no t yet approved or cleared by the [place] FDA and  has been authorized for detection and/or diagnosis of SARS-CoV-2 by FDA under an Emergency Use Authorization (EUA). This EUA will remain  in effect (meaning this test can be used) for the duration of the COVID-19 declaration under Section 564(b)(1) of the Act, 21 U.S.C. section 360bbb-3(b)(1), unless the authorization is terminated or revoked  sooner.     Influenza A by PCR NEGATIVE NEGATIVE Final   Influenza B by PCR NEGATIVE NEGATIVE Final    Comment: (NOTE) The Xpert Xpress SARS-CoV-2/FLU/RSV assay is intended as an aid in  the diagnosis of influenza from Nasopharyngeal swab specimens and  should not be used as a sole basis for treatment. Nasal washings and  aspirates are unacceptable for Xpert Xpress SARS-CoV-2/FLU/RSV  testing.  Fact Sheet for Patients: PinkCheek.be  Fact Sheet for Healthcare Providers: GravelBags.it  This test is not yet approved or cleared by the [place] FDA and  has been authorized for detection and/or diagnosis of SARS-CoV-2 by  FDA under an Emergency Use Authorization (EUA). This EUA will remain  in effect (meaning this test can be used) for the duration of the  Covid-19 declaration under Section 564(b)(1) of the Act, 21  U.S.C. section 360bbb-3(b)(1), unless the authorization is  terminated or revoked. Performed at  [facility], [address], [place], [place] [address]          Radiology Studies: No results found.      Scheduled Meds: Continuous Infusions: . sodium chloride       LOS: 1 day    Time spent: 33 mins     Wyvonnia Dusky, MD [facility] Pager [phone]  If 7PM-7AM, please contact night-coverage [email or web address] 02/13/2020, 7:12 AM

## 2020-02-13 NOTE — Progress Notes (Signed)
Pt is alert and orientedx4 and able to independently maneuver around his room. Pt reports back pain that is chronic and was effectively relieved with PRN medications. Pt did not endorse any additional discomforts during the shift and no blood loss was noted.

## 2020-02-13 NOTE — ED Notes (Signed)
Re[port called to tomisha rn all questions answered

## 2020-02-13 NOTE — Consult Note (Signed)
Jerry Galloway , MD [address], [place], [place], [place], [address] [address] [place], [place], [place], [address] Phone: [phone]  Fax: [phone]  Consultation  Referring Provider:     Dr Sidney Ace  Primary Care Physician:  Sherald Hess., MD Primary Gastroenterologist:  None          Reason for Consultation:     Blood seen in the underwear  Date of Admission:  02/12/2020 Date of Consultation:  02/13/2020         HPI:   Jerry Galloway is a 69 y.o. male admitted to the hospital yesterday with a low hemoglobin.  Blood seen in the underwear and GI consulted.  Past medical history of rheumatoid arthritis smoking.  On admission hemoglobin 7.5 g with MCV of 100.4, iron studies normal with a ferritin of 212.  B12 folate pending.  Creatinine 3.53. Being transfused 2 units of PRBCs.  The patient completely denies any blood loss. He denies any blood in his underwear or blood in his stool. He says he had a colonoscopy about a year back. Was normal. Denies any nasal bleeds. Denies any blood in the urine. History reviewed. No pertinent past medical history.  History reviewed. No pertinent surgical history.  Prior to Admission medications   Medication Sig Start Date End Date Taking? Authorizing Provider  aspirin 81 MG EC tablet Take 1 tablet by mouth daily.   Yes [provider]  buprenorphine (BUTRANS) 15 MCG/HR 1 patch once a week. 11/02/19  Yes [provider]  ENDOCET 7.5-325 MG tablet Take 1-2 tablets by mouth 4 (four) times daily as needed. 02/08/20  Yes [provider]  hydrochlorothiazide (HYDRODIURIL) 25 MG tablet Take 25 mg by mouth daily. 11/03/19  Yes [provider]  lisinopril (ZESTRIL) 40 MG tablet Take 40 mg by mouth daily. 11/03/19  Yes [provider]  LORazepam (ATIVAN) 0.5 MG tablet Take 1-2 tablets by mouth at bedtime.  01/25/20  Yes [provider]  nitroGLYCERIN (NITROSTAT) 0.4 MG SL tablet Place 0.4 mg  under the tongue every 5 (five) minutes as needed.  01/11/20  Yes [provider]  ondansetron (ZOFRAN-ODT) 8 MG disintegrating tablet Take 8 mg by mouth every 6 (six) hours as needed.  01/04/20  Yes [provider]  prochlorperazine (COMPAZINE) 5 MG tablet Take 5 mg by mouth every 6 (six) hours as needed.  01/01/20  Yes [provider]  rosuvastatin (CRESTOR) 40 MG tablet Take 40 mg by mouth daily. 11/03/19  Yes [provider]  Vitamin D, Ergocalciferol, (DRISDOL) 1.25 MG (50000 UNIT) CAPS capsule Take 50,000 Units by mouth once a week. 09/28/19  Yes [provider]  XTAMPZA ER 13.5 MG C12A Take 1 capsule by mouth 2 (two) times daily. 12/19/19  Yes [provider]    No family history on file.   Social History   Tobacco Use   Smoking status: Current Every Day Smoker   Smokeless tobacco: Never Used  Substance Use Topics   Alcohol use: Never   Drug use: Never    Allergies as of 02/12/2020   (No Known Allergies)    Review of Systems:    All systems reviewed and negative except where noted in HPI.   Physical Exam:  Vital signs in last 24 hours: Temp:  [97.9 F (36.6 C)-98.4 F (36.9 C)] 98.4 F (36.9 C) (11/09 0518) Pulse Rate:  [69-92] 91 (11/09 0518) Resp:  [17-18] 17 (11/09 0518) BP: (  131-176)/(49-78) 164/77 (11/09 0518) SpO2:  [97 %-100 %] 99 % (11/09 0518) Weight:  [72.6 kg] 72.6 kg (11/08 1518) Last BM Date: 02/12/20 General:   Pleasant, cooperative in NAD Head:  Normocephalic and atraumatic. Eyes:   No icterus.   Conjunctiva pink. PERRLA. Ears:  Normal auditory acuity. Neck:  Supple; no masses or thyroidomegaly Lungs: Respirations even and unlabored. Lungs clear to auscultation bilaterally.   No wheezes, crackles, or rhonchi.  Heart:  Regular rate and rhythm;  Without murmur, clicks, rubs or gallops Abdomen:  Soft, nondistended, nontender. Normal bowel sounds. No appreciable masses or hepatomegaly.  No rebound or  guarding.  Neurologic:  Alert and oriented x3;  grossly normal neurologically. Skin:  Intact without significant lesions or rashes. Cervical Nodes:  No significant cervical adenopathy. Psych:  Alert and cooperative. Normal affect.  LAB RESULTS: Recent Labs    02/12/20 1522 02/13/20 0405 02/13/20 0924  WBC 7.3 7.8  --   HGB 7.5* 8.3* 8.9*  HCT 23.7* 26.2* 27.2*  PLT 298 279  --    BMET Recent Labs    02/12/20 1522 02/13/20 0405  NA 137 138  K 4.6 4.3  CL 108 111  CO2 20* 18*  GLUCOSE 138* 115*  BUN 28* 23  CREATININE 3.53* 3.11*  CALCIUM 8.9 8.5*   LFT No results for input(s): PROT, ALBUMIN, AST, ALT, ALKPHOS, BILITOT, BILIDIR, IBILI in the last 72 hours. PT/INR No results for input(s): LABPROT, INR in the last 72 hours.  STUDIES: No results found.    Impression / Plan:   Jerry Galloway is a 69 y.o. y/o male history of end-stage renal disease admitted to the hospital with a low hemoglobin.  Macrocytic anemia. I was consulted to see the patient for blood in the underwear but he completely denies any blood seen anywhere. Anemia could be secondary to chronic kidney disease and anemia of chronic kidney disease.  Plan 1.  Monitor CBC and transfuse 2.  Follow-up B12 and folate levels 3. Monitor stools and obtain old colonoscopy report. Macrocytic anemia is usually not associated with blood loss. If blood seen in the stools visibly can consider further evaluation.  Thank you for involving me in the care of this patient.      LOS: 1 day   Jerry Bellows, MD  02/13/2020, 10:29 AM

## 2020-02-13 NOTE — Plan of Care (Signed)
  Problem: Education: Goal: Knowledge of General Education information will improve Description: Including pain rating scale, medication(s)/side effects and non-pharmacologic comfort measures 02/13/2020 1027 by Cherylann Parr, RN Outcome: Progressing 02/13/2020 1027 by Cherylann Parr, RN Outcome: Progressing   Problem: Education: Goal: Ability to identify signs and symptoms of gastrointestinal bleeding will improve 02/13/2020 1027 by Cherylann Parr, RN Outcome: Progressing 02/13/2020 1027 by Cherylann Parr, RN Outcome: Progressing   Problem: Bowel/Gastric: Goal: Will show no signs and symptoms of gastrointestinal bleeding 02/13/2020 1027 by Cherylann Parr, RN Outcome: Progressing 02/13/2020 1027 by Cherylann Parr, RN Outcome: Progressing   Problem: Fluid Volume: Goal: Will show no signs and symptoms of excessive bleeding 02/13/2020 1027 by Cherylann Parr, RN Outcome: Progressing 02/13/2020 1027 by Cherylann Parr, RN Outcome: Progressing   Problem: Clinical Measurements: Goal: Complications related to the disease process, condition or treatment will be avoided or minimized 02/13/2020 1027 by Cherylann Parr, RN Outcome: Progressing 02/13/2020 1027 by Cherylann Parr, RN Outcome: Progressing

## 2020-02-13 NOTE — Progress Notes (Signed)
Pt seems very anxious and ready to be discharged today, explained to pt that the MD will round on him and discuss plan, pt agreed at this time

## 2020-02-14 DIAGNOSIS — D649 Anemia, unspecified: Secondary | ICD-10-CM

## 2020-02-14 DIAGNOSIS — N179 Acute kidney failure, unspecified: Secondary | ICD-10-CM

## 2020-02-14 DIAGNOSIS — E7849 Other hyperlipidemia: Secondary | ICD-10-CM

## 2020-02-14 LAB — CBC WITH DIFFERENTIAL/PLATELET
Abs Immature Granulocytes: 0.02 10*3/uL (ref 0.00–0.07)
Basophils Absolute: 0.1 10*3/uL (ref 0.0–0.1)
Basophils Relative: 1 %
Eosinophils Absolute: 0.4 10*3/uL (ref 0.0–0.5)
Eosinophils Relative: 5 %
HCT: 26.3 % — ABNORMAL LOW (ref 39.0–52.0)
Hemoglobin: 8.5 g/dL — ABNORMAL LOW (ref 13.0–17.0)
Immature Granulocytes: 0 %
Lymphocytes Relative: 24 %
Lymphs Abs: 1.7 10*3/uL (ref 0.7–4.0)
MCH: 31.3 pg (ref 26.0–34.0)
MCHC: 32.3 g/dL (ref 30.0–36.0)
MCV: 96.7 fL (ref 80.0–100.0)
Monocytes Absolute: 0.3 10*3/uL (ref 0.1–1.0)
Monocytes Relative: 4 %
Neutro Abs: 4.6 10*3/uL (ref 1.7–7.7)
Neutrophils Relative %: 66 %
Platelets: 279 10*3/uL (ref 150–400)
RBC: 2.72 MIL/uL — ABNORMAL LOW (ref 4.22–5.81)
RDW: 14.7 % (ref 11.5–15.5)
WBC: 7.1 10*3/uL (ref 4.0–10.5)
nRBC: 0 % (ref 0.0–0.2)

## 2020-02-14 LAB — FERRITIN: Ferritin: 219 ng/mL (ref 24–336)

## 2020-02-14 LAB — BASIC METABOLIC PANEL
Anion gap: 11 (ref 5–15)
BUN: 21 mg/dL (ref 8–23)
CO2: 16 mmol/L — ABNORMAL LOW (ref 22–32)
Calcium: 8.6 mg/dL — ABNORMAL LOW (ref 8.9–10.3)
Chloride: 114 mmol/L — ABNORMAL HIGH (ref 98–111)
Creatinine, Ser: 2.54 mg/dL — ABNORMAL HIGH (ref 0.61–1.24)
GFR, Estimated: 26 mL/min — ABNORMAL LOW (ref >60)
Glucose, Bld: 98 mg/dL (ref 70–99)
Potassium: 4.2 mmol/L (ref 3.5–5.1)
Sodium: 141 mmol/L (ref 135–145)

## 2020-02-14 LAB — TRANSFERRIN: Transferrin: 170 mg/dL — ABNORMAL LOW (ref 180–329)

## 2020-02-14 LAB — IRON AND TIBC
Iron: 76 ug/dL (ref 45–182)
Saturation Ratios: 33 % (ref 17.9–39.5)
TIBC: 230 ug/dL — ABNORMAL LOW (ref 250–450)
UIBC: 154 ug/dL

## 2020-02-14 LAB — FOLATE RBC
Folate, Hemolysate: 239 ng/mL
Folate, RBC: 1030 ng/mL (ref >498)
Hematocrit: 23.2 % — ABNORMAL LOW (ref 37.5–51.0)

## 2020-02-14 LAB — GLUCOSE, CAPILLARY
Glucose-Capillary: 111 mg/dL — ABNORMAL HIGH (ref 70–99)
Glucose-Capillary: 94 mg/dL (ref 70–99)

## 2020-02-14 MED ORDER — NICOTINE 21 MG/24HR TD PT24
21.0000 mg | MEDICATED_PATCH | Freq: Every day | TRANSDERMAL | 0 refills | Status: DC
Start: 2020-02-14 — End: 2021-11-17

## 2020-02-14 MED ORDER — CALCITRIOL 0.25 MCG PO CAPS: 0.2500 ug | ORAL_CAPSULE | Freq: Every day | ORAL | 0 refills | Status: AC

## 2020-02-14 MED ORDER — AMLODIPINE BESYLATE 5 MG PO TABS
2.5000 mg | ORAL_TABLET | Freq: Every day | ORAL | Status: DC
  Administered 2020-02-14: 11:00:00 2.5 mg via ORAL
  Filled 2020-02-14: qty 1

## 2020-02-14 MED ORDER — SODIUM BICARBONATE 650 MG PO TABS: 1300.0000 mg | ORAL_TABLET | Freq: Two times a day (BID) | ORAL | 0 refills | Status: AC

## 2020-02-14 MED ORDER — AMLODIPINE BESYLATE 5 MG PO TABS: 5.0000 mg | ORAL_TABLET | Freq: Every day | ORAL | 0 refills | Status: DC

## 2020-02-14 NOTE — Progress Notes (Signed)
Pt being discharged home, discharge instructions reviewed with pt and wife via telephone, states understanding, pt with no complaints

## 2020-02-14 NOTE — TOC Transition Note (Signed)
Transition of Care [facility]) - CM/SW Discharge Note   Patient Details  Name: Jerry Galloway MRN: [id number] Date of Birth: 02/08/1951  [facility] Care [facility]) CM/SW Contact:  Shelbie Hutching, RN Phone Number: 02/14/2020, 1:11 PM   Clinical Narrative:    RNCM was able to speak with patient's wife and inform her that patient will discharge home today.  Wife, Vaughan Basta, confirmed that patient is open with Hospice but she could not give me the name.  Wife reports that Hospice agency is aware of admission and she will call them to notify that he is coming home today.   Patient's daughter will be picking him up at discharge.   Final next level of care: Home w Hospice Care Barriers to Discharge: Barriers Resolved   Patient Goals and CMS Choice Patient states their goals for this hospitalization and ongoing recovery are:: Just to go home CMS Medicare.gov Compare Post Acute Care list provided to:: Patient Choice offered to / list presented to : Patient  Discharge Placement                       Discharge Plan and Services   Discharge Planning Services: CM Consult              DME Agency: NA                  Social Determinants of Health (SDOH) Interventions     Readmission Risk Interventions No flowsheet data found.

## 2020-02-14 NOTE — TOC Initial Note (Signed)
Transition of Care [facility]) - Initial/Assessment Note    Patient Details  Name: Jerry Galloway MRN: [id number] Date of Birth: 03/08/1951  Transition of Care [facility]) CM/SW Contact:    Shelbie Hutching, RN Phone Number: 02/14/2020, 9:38 AM  Clinical Narrative:                 Patient admitted to the hospital for symptomatic anemia.  RNCM was able to speak with patient at the bedside, patient is very hard of hearing.  Patient reports that he lives with his wife, Jerry Galloway.  Jerry Galloway provides transportation.  Patient has a walker and cane at home.  PCP is Dr. Royce Macadamia.  Patient reports that he has people that come out to house but he could not tell me the name of the company.   Message left with patient's wife for return call.    Expected Discharge Plan: Home/Self Care Barriers to Discharge: Continued Medical Work up   Patient Goals and CMS Choice        Expected Discharge Plan and Services Expected Discharge Plan: Home/Self Care   Discharge Planning Services: CM Consult   Living arrangements for the past 2 months: Single Family Home                   DME Agency: NA                  Prior Living Arrangements/Services Living arrangements for the past 2 months: Single Family Home Lives with:: Spouse Patient language and need for interpreter reviewed:: Yes Do you feel safe going back to the place where you live?: Yes      Need for Family Participation in Patient Care: Yes (Comment) Care giver support system in place?: Yes (comment) (wife and daughter) Current home services: DME (walker and cane) Criminal Activity/Legal Involvement Pertinent to Current Situation/Hospitalization: No - Comment as needed  Activities of Daily Living Home Assistive Devices/Equipment: None ADL Screening (condition at time of admission) Patient's cognitive ability adequate to safely complete daily activities?: Yes Is the patient deaf or have difficulty hearing?: Yes Does the patient have difficulty  seeing, even when wearing glasses/contacts?: No Does the patient have difficulty concentrating, remembering, or making decisions?: No Patient able to express need for assistance with ADLs?: Yes Does the patient have difficulty dressing or bathing?: No Independently performs ADLs?: Yes (appropriate for developmental age) Does the patient have difficulty walking or climbing stairs?: No Weakness of Legs: None Weakness of Arms/Hands: None  Permission Sought/Granted Permission sought to share information with : Case Manager, Family Supports Permission granted to share information with : Yes, Verbal Permission Granted  Share Information with NAME: Jerry Galloway     Permission granted to share info w Relationship: wife     Emotional Assessment Appearance:: Appears stated age Attitude/Demeanor/Rapport: Engaged Affect (typically observed): Accepting Orientation: : Oriented to Self, Oriented to Place, Oriented to  Time, Oriented to Situation Alcohol / Substance Use: Not Applicable Psych Involvement: No (comment)  Admission diagnosis:  'light-for-dates' infant with signs of fetal malnutrition [P05.00] Symptomatic anemia [D64.9] Gastrointestinal hemorrhage, unspecified gastrointestinal hemorrhage type [K92.2] Chronic renal failure, unspecified CKD stage [N18.9] Patient Active Problem List   Diagnosis Date Noted  . Symptomatic anemia 02/13/2020  . ESRD (end stage renal disease) ([facility])   . Other hyperlipidemia   . 'light-for-dates' infant with signs of fetal malnutrition 02/12/2020   PCP:  Sherald Hess., MD Pharmacy:   [facility] [place], [place] [address] [place] [address] Phone: [phone] Fax: [phone]     Social Determinants of Health (SDOH) Interventions    Readmission Risk Interventions No flowsheet data found.

## 2020-02-14 NOTE — Discharge Summary (Signed)
Discharge Summary  Jerry Galloway YTK:[id number] DOB: 1950/11/10  PCP: Sherald Hess., MD  Admit date: 02/12/2020 Discharge date: 02/14/2020  Time spent: 40 mins  Recommendations for Outpatient Follow-up:  1. PCP in 1 week 2. Nephrology as scheduled  Discharge Diagnoses:  Active Hospital Problems   Diagnosis Date Noted  . Symptomatic anemia 02/13/2020  . ESRD (end stage renal disease) ([place])   . Other hyperlipidemia     Resolved Hospital Problems  No resolved problems to display.    Discharge Condition: Stable  Diet recommendation: Heart healthy/renal  Vitals:   02/14/20 0803 02/14/20 1102  BP: (!) 163/63 (!) 166/68  Pulse: 69 68  Resp: 18 18  Temp: 98.1 F (36.7 C) 98.1 F (36.7 C)  SpO2: 99% 99%    History of present illness:  Jerry Galloway  is a 69 y.o. male with a known history of hypertension, rheumatoid arthritis, tobacco abuse, and anxiety and depression, who presented to the emergency room with anemia being sent in by his primary care physician. Upon presenting to the emergency room, Blood pressure was 131/49 with otherwise normal vital signs. Labs revealed BUN of 28 and creatinine 3.53, hemoglobin of 7.5 and hematocrit 23.7.  Of note, patient was recently treated at [facility] for acute renal failure and had couple of sessions of dialysis, of which patient refused further dialysis.  Patient does have an improvement of his renal function.  Patient admitted for further management.    Today, patient denied any new complaints, very eager to be discharged.  Denies any chest pain, abdominal pain, shortness of breath, nausea/vomiting, fever/chills, bright red blood per rectum, melena, hematemesis.  Discussed with patient extensively about follow-up with PCP as well as nephrology.  Nephrologist okay for patient to be discharged with close follow-up.  Patient is also active with hospice care.    Hospital Course:  Principal Problem:   Symptomatic  anemia Active Problems:   ESRD (end stage renal disease) (H[facility])   Other hyperlipidemia   Symptomatic anemia likely 2/2 severe acute renal failure ??Probable GI origin Patient denied any evidence of bleeding, no FOBT done, no witnessed bleeding during this admission Reports recent colonoscopy which was normal Hemoglobin has remained stable s/p 2 units of PRBC Follow-up with PCP as an outpatient  Acute renal failure Secondary hyperparathyroidism Metabolic acidosis Creatinine improving, no indication for further HD as per nephrology Creatinine during admission to [facility] in September 2021 was 13, patient underwent 7 dialysis sessions and subsequently refused and was discharged home with hospice Continue calcitriol, sodium bicarbonate Outpatient follow-up with nephrology  Hypertension Continue gently started amlodipine Discontinue HCTZ, hold off lisinopril Follow-up with nephrology, PCP  Anxiety Continue Klonopin  Dyslipidemia Continue statin therapy  Depression Continue Zoloft  Tobacco abuse Advised to quit Nicotine patch          Malnutrition Type:      Malnutrition Characteristics:      Nutrition Interventions:      Estimated body mass index is 21.7 kg/m as calculated from the following:   Height as of this encounter: 6' (1.829 m).   Weight as of this encounter: 72.6 kg.    Procedures:  None  Consultations:  Nephrology  GI   Discharge Exam: BP (!) 166/68 (BP Location: Right Arm)   Pulse 68   Temp 98.1 F (36.7 C) (Oral)   Resp 18   Ht 6' (1.829 m)   Wt 72.6 kg   SpO2 99%   BMI 21.70 kg/m  General: NAD Cardiovascular: S1, S2 present Respiratory: CTA B Abdomen: Soft, nontender, nondistended, bowel sounds present    Discharge Instructions You were cared for by a hospitalist during your hospital stay. If you have any questions about your discharge medications or the care you received while you were in the hospital after you  are discharged, you can call the unit and asked to speak with the hospitalist on call if the hospitalist that took care of you is not available. Once you are discharged, your primary care physician will handle any further medical issues. Please note that NO REFILLS for any discharge medications will be authorized once you are discharged, as it is imperative that you return to your primary care physician (or establish a relationship with a primary care physician if you do not have one) for your aftercare needs so that they can reassess your need for medications and monitor your lab values.  Discharge Instructions    Diet - low sodium heart healthy   Complete by: As directed    Increase activity slowly   Complete by: As directed      Allergies as of 02/14/2020   No Known Allergies     Medication List    STOP taking these medications   aspirin 81 MG EC tablet   hydrochlorothiazide 25 MG tablet Commonly known as: HYDRODIURIL   lisinopril 40 MG tablet Commonly known as: ZESTRIL     TAKE these medications   amLODipine 5 MG tablet Commonly known as: NORVASC Take 1 tablet (5 mg total) by mouth daily. Start taking on: February 15, 2020   buprenorphine 15 MCG/HR Commonly known as: BUTRANS 1 patch once a week.   calcitRIOL 0.25 MCG capsule Commonly known as: ROCALTROL Take 1 capsule (0.25 mcg total) by mouth daily. Start taking on: February 15, 2020   Endocet 7.5-325 MG tablet Generic drug: oxyCODONE-acetaminophen Take 1-2 tablets by mouth 4 (four) times daily as needed.   LORazepam 0.5 MG tablet Commonly known as: ATIVAN Take 1-2 tablets by mouth at bedtime.   nicotine 21 mg/24hr patch Commonly known as: NICODERM CQ - dosed in mg/24 hours Place 1 patch (21 mg total) onto the skin daily.   nitroGLYCERIN 0.4 MG SL tablet Commonly known as: NITROSTAT Place 0.4 mg under the tongue every 5 (five) minutes as needed.   ondansetron 8 MG disintegrating tablet Commonly known as:  ZOFRAN-ODT Take 8 mg by mouth every 6 (six) hours as needed.   prochlorperazine 5 MG tablet Commonly known as: COMPAZINE Take 5 mg by mouth every 6 (six) hours as needed.   rosuvastatin 40 MG tablet Commonly known as: CRESTOR Take 40 mg by mouth daily.   sodium bicarbonate 650 MG tablet Take 2 tablets (1,300 mg total) by mouth 2 (two) times daily.   Vitamin D (Ergocalciferol) 1.25 MG (50000 UNIT) Caps capsule Commonly known as: DRISDOL Take 50,000 Units by mouth once a week.   Xtampza ER 13.5 MG C12a Generic drug: oxyCODONE ER Take 1 capsule by mouth 2 (two) times daily.      No Known Allergies  Follow-up Information    Sherald Hess., MD. Schedule an appointment as soon as possible for a visit in 1 week(s).   Specialty: Family Medicine Contact information: [place] [address] [phone]        Murlean Iba, MD Follow up.   Specialty: Nephrology Contact information: [place] [address] [phone]  The results of significant diagnostics from this hospitalization (including imaging, microbiology, ancillary and laboratory) are listed below for reference.    Significant Diagnostic Studies: No results found.  Microbiology: Recent Results (from the past 240 hour(s))  Respiratory Panel by RT PCR (Flu A&B, Covid) - Nasopharyngeal Swab     Status: None   Collection Time: 02/12/20  9:09 PM   Specimen: Nasopharyngeal Swab  Result Value Ref Range Status   SARS Coronavirus 2 by RT PCR NEGATIVE NEGATIVE Final    Comment: (NOTE) SARS-CoV-2 target nucleic acids are NOT DETECTED.  The SARS-CoV-2 RNA is generally detectable in upper respiratoy specimens during the acute phase of infection. The lowest concentration of SARS-CoV-2 viral copies this assay can detect is 131 copies/mL. A negative result does not preclude SARS-Cov-2 infection and should not be used as the sole basis for treatment  or other patient management decisions. A negative result may occur with  improper specimen collection/handling, submission of specimen other than nasopharyngeal swab, presence of viral mutation(s) within the areas targeted by this assay, and inadequate number of viral copies (<131 copies/mL). A negative result must be combined with clinical observations, patient history, and epidemiological information. The expected result is Negative.  Fact Sheet for Patients:  PinkCheek.be  Fact Sheet for Healthcare Providers:  GravelBags.it  This test is no t yet approved or cleared by the [place] FDA and  has been authorized for detection and/or diagnosis of SARS-CoV-2 by FDA under an Emergency Use Authorization (EUA). This EUA will remain  in effect (meaning this test can be used) for the duration of the COVID-19 declaration under Section 564(b)(1) of the Act, 21 U.S.C. section 360bbb-3(b)(1), unless the authorization is terminated or revoked sooner.     Influenza A by PCR NEGATIVE NEGATIVE Final   Influenza B by PCR NEGATIVE NEGATIVE Final    Comment: (NOTE) The Xpert Xpress SARS-CoV-2/FLU/RSV assay is intended as an aid in  the diagnosis of influenza from Nasopharyngeal swab specimens and  should not be used as a sole basis for treatment. Nasal washings and  aspirates are unacceptable for Xpert Xpress SARS-CoV-2/FLU/RSV  testing.  Fact Sheet for Patients: PinkCheek.be  Fact Sheet for Healthcare Providers: GravelBags.it  This test is not yet approved or cleared by the [place] FDA and  has been authorized for detection and/or diagnosis of SARS-CoV-2 by  FDA under an Emergency Use Authorization (EUA). This EUA will remain  in effect (meaning this test can be used) for the duration of the  Covid-19 declaration under Section 564(b)(1) of the Act, 21  U.S.C.  section 360bbb-3(b)(1), unless the authorization is  terminated or revoked. Performed at [facility], [address] [place], [place] [address]      Labs: Basic Metabolic Panel: Recent Labs  Lab 02/12/20 1522 02/13/20 0405 02/14/20 0733  NA 137 138 141  K 4.6 4.3 4.2  CL 108 111 114*  CO2 20* 18* 16*  GLUCOSE 138* 115* 98  BUN 28* 23 21  CREATININE 3.53* 3.11* 2.54*  CALCIUM 8.9 8.5* 8.6*   Liver Function Tests: No results for input(s): AST, ALT, ALKPHOS, BILITOT, PROT, ALBUMIN in the last 168 hours. No results for input(s): LIPASE, AMYLASE in the last 168 hours. No results for input(s): AMMONIA in the last 168 hours. CBC: Recent Labs  Lab 02/12/20 1522 02/13/20 0405 02/13/20 0924 02/13/20 1549 02/14/20 0733  WBC 7.3 7.8  --   --  7.1  NEUTROABS  --   --   --   --  4.6  HGB 7.5* 8.3* 8.9* 8.4* 8.5*  HCT 23.7* 26.2* 27.2* 25.6* 26.3*  MCV 100.4* 96.7  --   --  96.7  PLT 298 279  --   --  279   Cardiac Enzymes: No results for input(s): CKTOTAL, CKMB, CKMBINDEX, TROPONINI in the last 168 hours. BNP: BNP (last 3 results) No results for input(s): BNP in the last 8760 hours.  ProBNP (last 3 results) No results for input(s): PROBNP in the last 8760 hours.  CBG: Recent Labs  Lab 02/14/20 0807 02/14/20 1103  GLUCAP 111* 94       Signed:  Alma Friendly, MD [facility] 02/14/2020, 12:59 PM

## 2020-02-14 NOTE — Progress Notes (Signed)
[facility] CONSULT NOTE    Date: 02/14/2020                  Patient Name:  Jerry Galloway  MRN: [id number]  DOB: 12/26/1950  Age / Sex: 69 y.o., male         PCP: Sherald Hess., MD                 Service Requesting Consult: Hospitalist                 Reason for Consult: ARF             History of Present Illness: Patient is a 69 y.o. male with  HTN, RA, ARF recently treated at [facility] hospital who was admitted to [facility] on 02/12/2020 for evaluation of anemia. . Patient is doing fair today Denies any acute complaints States he is able to eat without nausea or vomiting Serum creatinine trend is improved Lab Results  Component Value Date   CREATININE 2.54 (H) 02/14/2020   CREATININE 3.11 (H) 02/13/2020   CREATININE 3.53 (H) 02/12/2020      Vital Signs: Blood pressure (!) 163/63, pulse 69, temperature 98.1 F (36.7 C), temperature source Oral, resp. rate 18, height 6' (1.829 m), weight 72.6 kg, SpO2 99 %.  Weight trends: Filed Weights   02/12/20 1518  Weight: 72.6 kg     Physical Exam: General:  No acute distress,resting in bed  Head:  Normocephalic, atraumatic. Moist oral mucosal membranes  Eyes:  Anicteric  Lungs:   Clear to auscultation, normal effort  Heart:  S1S2 no rubs or gallops  Abdomen:   Soft, nontender  Extremities:  No peripheral edema.  Neurologic:  Awake, alert, hard of hearing,speech clear and appropriate   Skin:  No lesions or rashes    Lab results: Basic Metabolic Panel: Recent Labs  Lab 02/12/20 1522 02/13/20 0405 02/14/20 0733  NA 137 138 141  K 4.6 4.3 4.2  CL 108 111 114*  CO2 20* 18* 16*  GLUCOSE 138* 115* 98  BUN 28* 23 21  CREATININE 3.53* 3.11* 2.54*  CALCIUM 8.9 8.5* 8.6*    Liver Function Tests: No results for input(s): AST, ALT, ALKPHOS, BILITOT, PROT, ALBUMIN in the last 168 hours. No results for input(s): LIPASE, AMYLASE in the last 168 hours. No results for input(s): AMMONIA in  the last 168 hours.  CBC: Recent Labs  Lab 02/12/20 1522 02/12/20 1522 02/13/20 0405 02/13/20 0405 02/13/20 0924 02/13/20 1549 02/14/20 0733  WBC 7.3  --  7.8  --   --   --  7.1  NEUTROABS  --   --   --   --   --   --  4.6  HGB 7.5*   < > 8.3*   < > 8.9* 8.4* 8.5*  HCT 23.7*   < > 26.2*   < > 27.2* 25.6* 26.3*  MCV 100.4*  --  96.7  --   --   --  96.7  PLT 298  --  279  --   --   --  279   < > = values in this interval not displayed.    Cardiac Enzymes: No results for input(s): CKTOTAL, CKMB, CKMBINDEX, TROPONINI in the last 168 hours.  BNP: Invalid input(s): POCBNP  CBG: No results for input(s): GLUCAP in the last 168 hours.  Microbiology: Results for orders placed or performed during the hospital encounter of 02/12/20  Respiratory Panel  by RT PCR (Flu A&B, Covid) - Nasopharyngeal Swab     Status: None   Collection Time: 02/12/20  9:09 PM   Specimen: Nasopharyngeal Swab  Result Value Ref Range Status   SARS Coronavirus 2 by RT PCR NEGATIVE NEGATIVE Final    Comment: (NOTE) SARS-CoV-2 target nucleic acids are NOT DETECTED.  The SARS-CoV-2 RNA is generally detectable in upper respiratoy specimens during the acute phase of infection. The lowest concentration of SARS-CoV-2 viral copies this assay can detect is 131 copies/mL. A negative result does not preclude SARS-Cov-2 infection and should not be used as the sole basis for treatment or other patient management decisions. A negative result may occur with  improper specimen collection/handling, submission of specimen other than nasopharyngeal swab, presence of viral mutation(s) within the areas targeted by this assay, and inadequate number of viral copies (<131 copies/mL). A negative result must be combined with clinical observations, patient history, and epidemiological information. The expected result is Negative.  Fact Sheet for Patients:  PinkCheek.be  Fact Sheet for Healthcare  Providers:  GravelBags.it  This test is no t yet approved or cleared by the [place] FDA and  has been authorized for detection and/or diagnosis of SARS-CoV-2 by FDA under an Emergency Use Authorization (EUA). This EUA will remain  in effect (meaning this test can be used) for the duration of the COVID-19 declaration under Section 564(b)(1) of the Act, 21 U.S.C. section 360bbb-3(b)(1), unless the authorization is terminated or revoked sooner.     Influenza A by PCR NEGATIVE NEGATIVE Final   Influenza B by PCR NEGATIVE NEGATIVE Final    Comment: (NOTE) The Xpert Xpress SARS-CoV-2/FLU/RSV assay is intended as an aid in  the diagnosis of influenza from Nasopharyngeal swab specimens and  should not be used as a sole basis for treatment. Nasal washings and  aspirates are unacceptable for Xpert Xpress SARS-CoV-2/FLU/RSV  testing.  Fact Sheet for Patients: PinkCheek.be  Fact Sheet for Healthcare Providers: GravelBags.it  This test is not yet approved or cleared by the [place] FDA and  has been authorized for detection and/or diagnosis of SARS-CoV-2 by  FDA under an Emergency Use Authorization (EUA). This EUA will remain  in effect (meaning this test can be used) for the duration of the  Covid-19 declaration under Section 564(b)(1) of the Act, 21  U.S.C. section 360bbb-3(b)(1), unless the authorization is  terminated or revoked. Performed at [facility], [address] [place], [place] [address]     Coagulation Studies: No results for input(s): LABPROT, INR in the last 72 hours.  Urinalysis: No results for input(s): COLORURINE, LABSPEC, PHURINE, GLUCOSEU, HGBUR, BILIRUBINUR, KETONESUR, PROTEINUR, UROBILINOGEN, NITRITE, LEUKOCYTESUR in the last 72 hours.  Invalid input(s): APPERANCEUR    Imaging: No results found.  EXAM: US RENAL COMPLETE  DATE: 12/07/2019 2:26 PM   ACCESSION: [id number]  DICTATED: 12/07/2019 2:28 PM  INTERPRETATION LOCATION: [facility]   CLINICAL INDICATION: 69 years old Male with acute renal failure    COMPARISON: None.   TECHNIQUE: Static andcine images of the kidneys and bladder were performed.   FINDINGS:   KIDNEYS: The kidneys are normal in size with increased echogenicity bilaterally. No solid masses or calculi. No hydronephrosis. Right renal cyst is better appreciated on same-day CT.     Scheduled Meds: . calcitRIOL  0.25 mcg Oral Daily  . nicotine  21 mg Transdermal Daily  . rosuvastatin  40 mg Oral Daily  . sodium bicarbonate  1,300 mg Oral BID  Continuous Infusions: . sodium chloride 75 mL/hr at 02/13/20 1718   PRN Meds:.labetalol, LORazepam, magnesium hydroxide, morphine injection, ondansetron **OR** ondansetron (ZOFRAN) IV, oxyCODONE-acetaminophen    Assessment/ Plan:  69 y.o. male with  HTN, Rheumatoid Arthritis,ARF recently treated at [facility], was admitted to [facility] on 02/12/2020 with anemia requiring blood transfusions was admitted on 02/12/2020 for anemia requiring PRBC transfusions. He has h/o HTN , RA and recent ARF treated at [facility].  Principal Problem:   Symptomatic anemia Active Problems:   ESRD (end stage renal disease) ([place])   Other hyperlipidemia  'light-for-dates' infant with signs of fetal malnutrition [P05.00] Symptomatic anemia [D64.9] Gastrointestinal hemorrhage, unspecified gastrointestinal hemorrhage type [K92.2] Chronic renal failure, unspecified CKD stage [N18.9]  #. Acute Renal failure  Patient was admitted to [facility] with acute renal failure in September,Renal function improved since then. Baseline creatinine  1.5 (jan 2021) per [facility] notes  Renal imaging as above - no hydronephrosis Right renal cyst noted U/A: not available. Will order Work up at [facility] 12/16/2019: ANCA positive, MPO and PR3 neg; Anti GBM neg, ANA neg, ENA neg,  PLA2R ab neg HIV neg SPEP neg [facility] U/A  = + protein, gluc, blood No iv contrast exposure NSAID - patient denies  Renal labs on admission to [facility] in September 2021 Creatinine 13 and K+ 6.5 Patient underwent 7 dialysis sessions Refused further dialysis and discharged from [facility] on 12/16/2019 with home hospice  Renal function today Creatinine 3.11 BUN 23 GFR 21 Renal function improved since last admission to [facility] No acute indication for dialysis We can follow up as outpatient   Plan: Obtain U/A Electrolytes and Volume status are acceptable S Creatinine trends are improving Outpatient f/u for CKD    #. Anemia of CKD  Lab Results  Component Value Date   HGB 8.5 (L) 02/14/2020  GI consult in process  Received PRBC transfusions   #Hypertension Blood pressure readings above goal Patient is on Labetalol as needed  # Secondary hyperparathyroidism of renal origin N 25.81  PTH 658 on 12/08/2019 Added calcitriol   # metabolic acidosis secondary to CKD Added Sodium bicarbonate supplentation     LOS: [place] 11/10/202110:37 AM  [place], [place]

## 2020-02-18 ENCOUNTER — Other Ambulatory Visit: Payer: Self-pay

## 2020-02-18 ENCOUNTER — Emergency Department: Payer: Medicare Other

## 2020-02-18 ENCOUNTER — Encounter: Payer: Self-pay | Admitting: Radiology

## 2020-02-18 ENCOUNTER — Inpatient Hospital Stay
Admission: EM | Admit: 2020-02-18 | Discharge: 2020-02-21 | DRG: 917 | Disposition: A | Discharge status: Home / Self Care | Payer: Medicare Other | Attending: Internal Medicine | Admitting: Internal Medicine

## 2020-02-18 DIAGNOSIS — Y929 Unspecified place or not applicable: Secondary | ICD-10-CM

## 2020-02-18 DIAGNOSIS — F1910 Other psychoactive substance abuse, uncomplicated: Secondary | ICD-10-CM

## 2020-02-18 DIAGNOSIS — T40601A Poisoning by unspecified narcotics, accidental (unintentional), initial encounter: Principal | ICD-10-CM | POA: Diagnosis present

## 2020-02-18 DIAGNOSIS — E785 Hyperlipidemia, unspecified: Secondary | ICD-10-CM

## 2020-02-18 DIAGNOSIS — N186 End stage renal disease: Secondary | ICD-10-CM | POA: Diagnosis present

## 2020-02-18 DIAGNOSIS — F1123 Opioid dependence with withdrawal: Secondary | ICD-10-CM | POA: Diagnosis present

## 2020-02-18 DIAGNOSIS — Z79899 Other long term (current) drug therapy: Secondary | ICD-10-CM

## 2020-02-18 DIAGNOSIS — E860 Dehydration: Secondary | ICD-10-CM | POA: Diagnosis present

## 2020-02-18 DIAGNOSIS — Z20822 Contact with and (suspected) exposure to covid-19: Secondary | ICD-10-CM | POA: Diagnosis present

## 2020-02-18 DIAGNOSIS — T50901A Poisoning by unspecified drugs, medicaments and biological substances, accidental (unintentional), initial encounter: Secondary | ICD-10-CM

## 2020-02-18 DIAGNOSIS — F17210 Nicotine dependence, cigarettes, uncomplicated: Secondary | ICD-10-CM | POA: Diagnosis present

## 2020-02-18 DIAGNOSIS — G9341 Metabolic encephalopathy: Secondary | ICD-10-CM | POA: Diagnosis not present

## 2020-02-18 DIAGNOSIS — I10 Essential (primary) hypertension: Secondary | ICD-10-CM

## 2020-02-18 DIAGNOSIS — I120 Hypertensive chronic kidney disease with stage 5 chronic kidney disease or end stage renal disease: Secondary | ICD-10-CM | POA: Diagnosis present

## 2020-02-18 DIAGNOSIS — Z992 Dependence on renal dialysis: Secondary | ICD-10-CM

## 2020-02-18 DIAGNOSIS — R4182 Altered mental status, unspecified: Secondary | ICD-10-CM

## 2020-02-18 DIAGNOSIS — N184 Chronic kidney disease, stage 4 (severe): Secondary | ICD-10-CM

## 2020-02-18 DIAGNOSIS — G8929 Other chronic pain: Secondary | ICD-10-CM | POA: Diagnosis present

## 2020-02-18 DIAGNOSIS — Z9115 Patient's noncompliance with renal dialysis: Secondary | ICD-10-CM

## 2020-02-18 DIAGNOSIS — G894 Chronic pain syndrome: Secondary | ICD-10-CM

## 2020-02-18 DIAGNOSIS — M549 Dorsalgia, unspecified: Secondary | ICD-10-CM | POA: Diagnosis present

## 2020-02-18 DIAGNOSIS — R112 Nausea with vomiting, unspecified: Secondary | ICD-10-CM

## 2020-02-18 HISTORY — DX: Chronic kidney disease, unspecified: N18.9

## 2020-02-18 HISTORY — DX: Essential (primary) hypertension: I10

## 2020-02-18 HISTORY — DX: Other chronic pain: G89.29

## 2020-02-18 LAB — CBC WITH DIFFERENTIAL/PLATELET
Abs Immature Granulocytes: 0.03 10*3/uL (ref 0.00–0.07)
Basophils Absolute: 0.1 10*3/uL (ref 0.0–0.1)
Basophils Relative: 1 %
Eosinophils Absolute: 0.5 10*3/uL (ref 0.0–0.5)
Eosinophils Relative: 4 %
HCT: 32.2 % — ABNORMAL LOW (ref 39.0–52.0)
Hemoglobin: 10.3 g/dL — ABNORMAL LOW (ref 13.0–17.0)
Immature Granulocytes: 0 %
Lymphocytes Relative: 16 %
Lymphs Abs: 1.9 10*3/uL (ref 0.7–4.0)
MCH: 31.1 pg (ref 26.0–34.0)
MCHC: 32 g/dL (ref 30.0–36.0)
MCV: 97.3 fL (ref 80.0–100.0)
Monocytes Absolute: 0.3 10*3/uL (ref 0.1–1.0)
Monocytes Relative: 3 %
Neutro Abs: 9.5 10*3/uL — ABNORMAL HIGH (ref 1.7–7.7)
Neutrophils Relative %: 76 %
Platelets: 335 10*3/uL (ref 150–400)
RBC: 3.31 MIL/uL — ABNORMAL LOW (ref 4.22–5.81)
RDW: 14.2 % (ref 11.5–15.5)
WBC: 12.3 10*3/uL — ABNORMAL HIGH (ref 4.0–10.5)
nRBC: 0 % (ref 0.0–0.2)

## 2020-02-18 LAB — URINE DRUG SCREEN, QUALITATIVE (ARMC ONLY)
Amphetamines, Ur Screen: NOT DETECTED
Barbiturates, Ur Screen: NOT DETECTED
Benzodiazepine, Ur Scrn: POSITIVE — AB
Cannabinoid 50 Ng, Ur ~~LOC~~: NOT DETECTED
Cocaine Metabolite,Ur ~~LOC~~: NOT DETECTED
MDMA (Ecstasy)Ur Screen: NOT DETECTED
Methadone Scn, Ur: NOT DETECTED
Opiate, Ur Screen: POSITIVE — AB
Phencyclidine (PCP) Ur S: NOT DETECTED
Tricyclic, Ur Screen: NOT DETECTED

## 2020-02-18 LAB — URINALYSIS, COMPLETE (UACMP) WITH MICROSCOPIC
Bacteria, UA: NONE SEEN
Bilirubin Urine: NEGATIVE
Glucose, UA: NEGATIVE mg/dL
Ketones, ur: NEGATIVE mg/dL
Leukocytes,Ua: NEGATIVE
Nitrite: NEGATIVE
Protein, ur: NEGATIVE mg/dL
Specific Gravity, Urine: 1.008 (ref 1.005–1.030)
Squamous Epithelial / HPF: NONE SEEN (ref 0–5)
pH: 5 (ref 5.0–8.0)

## 2020-02-18 LAB — COMPREHENSIVE METABOLIC PANEL
ALT: 8 U/L (ref 0–44)
AST: 16 U/L (ref 15–41)
Albumin: 3.9 g/dL (ref 3.5–5.0)
Alkaline Phosphatase: 78 U/L (ref 38–126)
Anion gap: 14 (ref 5–15)
BUN: 24 mg/dL — ABNORMAL HIGH (ref 8–23)
CO2: 19 mmol/L — ABNORMAL LOW (ref 22–32)
Calcium: 9.4 mg/dL (ref 8.9–10.3)
Chloride: 112 mmol/L — ABNORMAL HIGH (ref 98–111)
Creatinine, Ser: 2.79 mg/dL — ABNORMAL HIGH (ref 0.61–1.24)
GFR, Estimated: 23 mL/min — ABNORMAL LOW (ref >60)
Glucose, Bld: 104 mg/dL — ABNORMAL HIGH (ref 70–99)
Potassium: 3.9 mmol/L (ref 3.5–5.1)
Sodium: 145 mmol/L (ref 135–145)
Total Bilirubin: 1 mg/dL (ref 0.3–1.2)
Total Protein: 7.9 g/dL (ref 6.5–8.1)

## 2020-02-18 LAB — CK: Total CK: 54 U/L (ref 49–397)

## 2020-02-18 LAB — ETHANOL: Alcohol, Ethyl (B): <10 mg/dL (ref <10)

## 2020-02-18 LAB — LACTIC ACID, PLASMA: Lactic Acid, Venous: 1.7 mmol/L (ref 0.5–1.9)

## 2020-02-18 LAB — PROTIME-INR
INR: 1 (ref 0.8–1.2)
Prothrombin Time: 13 seconds (ref 11.4–15.2)

## 2020-02-18 LAB — CBG MONITORING, ED
Glucose-Capillary: 63 mg/dL — ABNORMAL LOW (ref 70–99)
Glucose-Capillary: 182 mg/dL — ABNORMAL HIGH (ref 70–99)

## 2020-02-18 LAB — ACETAMINOPHEN LEVEL: Acetaminophen (Tylenol), Serum: <10 ug/mL — ABNORMAL LOW (ref 10–30)

## 2020-02-18 LAB — TROPONIN I (HIGH SENSITIVITY): Troponin I (High Sensitivity): 19 ng/L — ABNORMAL HIGH (ref <18)

## 2020-02-18 LAB — COOXEMETRY PANEL: Methemoglobin: 0.2 % (ref 0.0–1.5)

## 2020-02-18 LAB — SALICYLATE LEVEL: Salicylate Lvl: <7 mg/dL — ABNORMAL LOW (ref 7.0–30.0)

## 2020-02-18 LAB — AMMONIA: Ammonia: 27 umol/L (ref 9–35)

## 2020-02-18 MED ORDER — LORAZEPAM 2 MG/ML IJ SOLN
2.0000 mg | Freq: Once | INTRAMUSCULAR | Status: AC
  Administered 2020-02-18: 2 mg via INTRAVENOUS
  Filled 2020-02-18: qty 1

## 2020-02-18 MED ORDER — NALOXONE HCL 2 MG/2ML IJ SOSY
0.4000 mg | PREFILLED_SYRINGE | INTRAMUSCULAR | Status: DC | PRN
  Filled 2020-02-18: qty 2

## 2020-02-18 MED ORDER — LORAZEPAM 2 MG/ML IJ SOLN: 1.0000 mg | Freq: Once | INTRAMUSCULAR | Status: DC

## 2020-02-18 MED ORDER — LORAZEPAM 2 MG/ML IJ SOLN
0.5000 mg | Freq: Once | INTRAMUSCULAR | Status: AC
  Administered 2020-02-18: 0.5 mg via INTRAVENOUS
  Filled 2020-02-18: qty 1

## 2020-02-18 MED ORDER — LORAZEPAM 2 MG/ML IJ SOLN
1.0000 mg | Freq: Once | INTRAMUSCULAR | Status: AC
  Administered 2020-02-18: 1 mg via INTRAVENOUS
  Filled 2020-02-18: qty 1

## 2020-02-18 MED ORDER — ENOXAPARIN SODIUM 30 MG/0.3ML ~~LOC~~ SOLN
30.0000 mg | Freq: Every day | SUBCUTANEOUS | Status: DC
  Administered 2020-02-19 – 2020-02-21 (×3): 30 mg via SUBCUTANEOUS
  Filled 2020-02-18 (×3): qty 0.3

## 2020-02-18 MED ORDER — ONDANSETRON HCL 4 MG/2ML IJ SOLN
4.0000 mg | Freq: Four times a day (QID) | INTRAMUSCULAR | Status: DC | PRN
  Administered 2020-02-20 (×2): 4 mg via INTRAVENOUS
  Filled 2020-02-18 (×2): qty 2

## 2020-02-18 MED ORDER — THIAMINE HCL 100 MG/ML IJ SOLN
100.0000 mg | Freq: Once | INTRAMUSCULAR | Status: AC
  Administered 2020-02-18: 100 mg via INTRAVENOUS
  Filled 2020-02-18: qty 2

## 2020-02-18 MED ORDER — DEXTROSE 50 % IV SOLN
25.0000 g | Freq: Once | INTRAVENOUS | Status: AC
  Administered 2020-02-18: 25 g via INTRAVENOUS
  Filled 2020-02-18: qty 50

## 2020-02-18 MED ORDER — SODIUM CHLORIDE 0.9 % IV SOLN: INTRAVENOUS | Status: DC

## 2020-02-18 MED ORDER — ONDANSETRON HCL 4 MG PO TABS: 4.0000 mg | ORAL_TABLET | Freq: Four times a day (QID) | ORAL | Status: DC | PRN

## 2020-02-18 NOTE — H&P (Signed)
History and Physical    Jerry Galloway QJJ:[id number] DOB: June 17, 1950 DOA: 02/18/2020  PCP: Sherald Hess., MD   Patient coming from: Home  I have personally briefly reviewed patient's old medical records in [place]  Chief Complaint: Found unresponsive  HPI: Jerry Galloway is a 69 y.o. 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  He was administered Narcan and became more responsive and EMS was called.  History is limited due to patient's altered mental status ED Course: On arrival patient was very restless, somewhat agitated.  Vitals were within normal limits.  Blood work significant for creatinine of 2.79 which is his baseline for CKD 4 with bicarb of 19 but otherwise unremarkable.  Acetaminophen level less than 10, salicylate less than 7, alcohol less than 10, ammonia 27.  Urine drug screen positive for opiates and benzos.  Urinalysis unremarkable. EKG as reviewed by me : Normal sinus rhythm with no acute ST-T wave changes CT head and chest x-ray with no acute findings.  Hospitalist consulted for admission.  Review of Systems: Unobtainable due to altered mental status Past Medical History:  Diagnosis Date  . Chronic pain   . CKD (chronic kidney disease)   . HTN (hypertension)     History reviewed. No pertinent surgical history.   reports that he has been smoking. He has never used smokeless tobacco. He reports that he does not drink alcohol and does not use drugs.  No Known Allergies  History reviewed. No pertinent family history.    Prior to Admission medications   Medication Sig Start Date End Date Taking? Authorizing Provider  amLODipine (NORVASC) 5 MG tablet Take 1 tablet (5 mg total) by mouth daily. 02/15/20 03/16/20   Alma Friendly, MD  buprenorphine (BUTRANS) 15 MCG/HR 1 patch once a week. 11/02/19   [provider]  calcitRIOL (ROCALTROL) 0.25 MCG capsule Take 1 capsule (0.25 mcg total) by mouth daily. 02/15/20 03/16/20  Alma Friendly, MD  ENDOCET 7.5-325 MG tablet Take 1-2 tablets by mouth 4 (four) times daily as needed. 02/08/20   [provider]  LORazepam (ATIVAN) 0.5 MG tablet Take 1-2 tablets by mouth at bedtime.  01/25/20   [provider]  nicotine (NICODERM CQ - DOSED IN MG/24 HOURS) 21 mg/24hr patch Place 1 patch (21 mg total) onto the skin daily. 02/14/20   Alma Friendly, MD  nitroGLYCERIN (NITROSTAT) 0.4 MG SL tablet Place 0.4 mg under the tongue every 5 (five) minutes as needed.  01/11/20   [provider]  ondansetron (ZOFRAN-ODT) 8 MG disintegrating tablet Take 8 mg by mouth every 6 (six) hours as needed.  01/04/20   [provider]  prochlorperazine (COMPAZINE) 5 MG tablet Take 5 mg by mouth every 6 (six) hours as needed.  01/01/20   [provider]  rosuvastatin (CRESTOR) 40 MG tablet Take 40 mg by mouth daily. 11/03/19   [provider]  sodium bicarbonate 650 MG tablet Take 2 tablets (1,300 mg total) by mouth 2 (two) times daily. 02/14/20 03/15/20  Alma Friendly, MD  Vitamin D, Ergocalciferol, (DRISDOL) 1.25 MG (50000 UNIT) CAPS capsule Take 50,000 Units by mouth once a week. 09/28/19   [provider]  XTAMPZA ER 13.5 MG C12A  Take 1 capsule by mouth 2 (two) times daily. 12/19/19   [provider]    Physical Exam: Vitals:   02/18/20 1956 02/18/20 2000 02/18/20 2002 02/18/20 2200  BP: (S) (!) 187/114 (!) 187/114  (!) 154/93  Pulse: (!) 128 (!) 143  92  Resp: 20 18  18   Temp: 97.8 F (36.6 C)     TempSrc: Axillary     SpO2: 100% 97%  97%  Weight:   72 kg   Height:   6' (1.829 m)      Vitals:   02/18/20 1956 02/18/20 2000 02/18/20 2002 02/18/20 2200  BP: (S) (!) 187/114 (!)  187/114  (!) 154/93  Pulse: (!) 128 (!) 143  92  Resp: 20 18  18   Temp: 97.8 F (36.6 C)     TempSrc: Axillary     SpO2: 100% 97%  97%  Weight:   72 kg   Height:   6' (1.829 m)       Constitutional:  Drowsy, difficult to arouse. Not in any apparent distress HEENT:      Head: Normocephalic and atraumatic.         Eyes: PERLA, EOMI, Conjunctivae are normal. Sclera is non-icteric.       Mouth/Throat: Mucous membranes are moist.       Neck: Supple with no signs of meningismus. Cardiovascular: Regular rate and rhythm. No murmurs, gallops, or rubs. 2+ symmetrical distal pulses are present . No JVD. No LE edema Respiratory: Respiratory effort normal .Lungs sounds clear bilaterally. No wheezes, crackles, or rhonchi.  Gastrointestinal: Soft, non tender, and non distended with positive bowel sounds. No rebound or guarding. Genitourinary: No CVA tenderness. Musculoskeletal: Nontender with normal range of motion in all extremities. No cyanosis, or erythema of extremities. Neurologic:  Face is symmetric. Moving all extremities. No gross focal neurologic deficits . Skin: Skin is warm, dry.  No rash or ulcers Psychiatric: Somnolent   Labs on Admission: I have personally reviewed following labs and imaging studies  CBC: Recent Labs  Lab 02/12/20 1522 02/12/20 1522 02/13/20 0405 02/13/20 0924 02/13/20 1549 02/14/20 0733 02/18/20 2015  WBC 7.3  --  7.8  --   --  7.1 12.3*  NEUTROABS  --   --   --   --   --  4.6 9.5*  HGB 7.5*   < > 8.3* 8.9* 8.4* 8.5* 10.3*  HCT 23.7*   < > 26.2* 27.2* 25.6* 26.3* 32.2*  MCV 100.4*  --  96.7  --   --  96.7 97.3  PLT 298  --  279  --   --  279 335   < > = values in this interval not displayed.   Basic Metabolic Panel: Recent Labs  Lab 02/12/20 1522 02/13/20 0405 02/14/20 0733 02/18/20 2015  NA 137 138 141 145  K 4.6 4.3 4.2 3.9  CL 108 111 114* 112*  CO2 20* 18* 16* 19*  GLUCOSE 138* 115* 98 104*  BUN 28* 23 21 24*  CREATININE 3.53* 3.11*  2.54* 2.79*  CALCIUM 8.9 8.5* 8.6* 9.4   GFR: Estimated Creatinine Clearance: 24.7 mL/min (A) (by C-G formula based on SCr of 2.79 mg/dL (H)). Liver Function Tests: Recent Labs  Lab 02/18/20 2015  AST 16  ALT 8  ALKPHOS 78  BILITOT 1.0  PROT 7.9  ALBUMIN 3.9   No results for input(s): LIPASE, AMYLASE in the last 168 hours. Recent Labs  Lab 02/18/20 2037  AMMONIA 27  Coagulation Profile: Recent Labs  Lab 02/18/20 2015  INR 1.0   Cardiac Enzymes: Recent Labs  Lab 02/18/20 2015  CKTOTAL 54   BNP (last 3 results) No results for input(s): PROBNP in the last 8760 hours. HbA1C: No results for input(s): HGBA1C in the last 72 hours. CBG: Recent Labs  Lab 02/14/20 0807 02/14/20 1103 02/18/20 2020 02/18/20 2144  GLUCAP 111* 94 63* 182*   Lipid Profile: No results for input(s): CHOL, HDL, LDLCALC, TRIG, CHOLHDL, LDLDIRECT in the last 72 hours. Thyroid Function Tests: No results for input(s): TSH, T4TOTAL, FREET4, T3FREE, THYROIDAB in the last 72 hours. Anemia Panel: No results for input(s): VITAMINB12, FOLATE, FERRITIN, TIBC, IRON, RETICCTPCT in the last 72 hours. Urine analysis:    Component Value Date/Time   COLORURINE YELLOW (A) 02/18/2020 2103   APPEARANCEUR CLEAR (A) 02/18/2020 2103   LABSPEC 1.008 02/18/2020 2103   PHURINE 5.0 02/18/2020 2103   GLUCOSEU NEGATIVE 02/18/2020 2103   HGBUR SMALL (A) 02/18/2020 2103   BILIRUBINUR NEGATIVE 02/18/2020 2103   KETONESUR NEGATIVE 02/18/2020 2103   PROTEINUR NEGATIVE 02/18/2020 2103   NITRITE NEGATIVE 02/18/2020 2103   LEUKOCYTESUR NEGATIVE 02/18/2020 2103    Radiological Exams on Admission: CT HEAD WO CONTRAST  Result Date: 02/18/2020 CLINICAL DATA:  69 year old male with delirium. EXAM: CT HEAD WITHOUT CONTRAST TECHNIQUE: Contiguous axial images were obtained from the base of the skull through the vertex without intravenous contrast. COMPARISON:  Head CT dated 12/12/2008. FINDINGS: Brain: The ventricles and  sulci appropriate size for patient's age. The gray-white matter discrimination is preserved. There is no acute intracranial hemorrhage. No mass effect or midline shift. No extra-axial fluid collection. Vascular: No hyperdense vessel or unexpected calcification. Skull: Normal. Negative for fracture or focal lesion. Sinuses/Orbits: Mild mucoperiosteal thickening of paranasal sinuses. No air-fluid level. Bilateral mastoid effusions. Other: None IMPRESSION: 1. No acute intracranial pathology. 2. Bilateral mastoid effusions. Electronically Signed   By: Anner Crete M.D.   On: 02/18/2020 21:36   DG Chest Port 1 View  Result Date: 02/18/2020 CLINICAL DATA:  Altered level of consciousness EXAM: PORTABLE CHEST 1 VIEW COMPARISON:  08/31/2007 FINDINGS: The heart size and mediastinal contours are within normal limits. Both lungs are clear. The visualized skeletal structures are unremarkable. IMPRESSION: No active disease. Electronically Signed   By: Randa Ngo M.D.   On: 02/18/2020 21:20     Assessment/Plan 69 year old male with history of HTN, CKD4 chronic pain with history of chronic opioid use who was brought to the emergency room after he was found unresponsive, responding to Narcan administered by daughter   Acute metabolic encephalopathy   Overdose, suspecting accidental or unintentional, initial encounter -UDS positive for benzos and opiates -Narcan as needed -Neurochecks and fall and aspiration precautions -Consider psych consult    Other chronic pain -Judicious narcotic use    Hypertension -Continue home meds    CKD (chronic kidney disease), stage IV (HCC) -Stable and at baseline    DVT prophylaxis: Lovenox  Code Status: full code  Family Communication:  none  Disposition Plan: Back to previous home environment Consults called: none  Status: Observation    Athena Masse MD [facility]     02/18/2020, 11:03 PM

## 2020-02-18 NOTE — ED Notes (Signed)
Pt remain uncooperative. Keeps removing B/P cuff and monitor leads

## 2020-02-18 NOTE — ED Provider Notes (Signed)
[facility] Emergency Department Provider Note ____________________________________________   First MD Initiated Contact with Patient 02/18/20 2017     (approximate)  I have reviewed the triage vital signs and the nursing notes.   HISTORY  Chief Complaint  EM caveat: Altered mental status  HPI Jerry Galloway is a 69 y.o. male   presents with EMS for concerns of possible overdose  EMS reports both the patient and his wife were found in their mobile home with decreased responsiveness and Narcan was administered by family member.  They did respond to this.  EMS reports the patient was agitated and slightly combative at times but altered mental status.  Denies that there is any running generators or known carbon oxide exposure.  There is no report of intentional overdose  They report the patient is thought to have a history of uterine chronic liver or kidney disease.  Patient received Narcan prior to EMS arrival  History reviewed. No pertinent past medical history.  Patient Active Problem List   Diagnosis Date Noted   Symptomatic anemia 02/13/2020   ESRD (end stage renal disease) ([place])    Other hyperlipidemia    'light-for-dates' infant with signs of fetal malnutrition 02/12/2020    No past surgical history on file.  Prior to Admission medications   Medication Sig Start Date End Date Taking? Authorizing Provider  amLODipine (NORVASC) 5 MG tablet Take 1 tablet (5 mg total) by mouth daily. 02/15/20 03/16/20  Alma Friendly, MD  buprenorphine (BUTRANS) 15 MCG/HR 1 patch once a week. 11/02/19   [provider]  calcitRIOL (ROCALTROL) 0.25 MCG capsule Take 1 capsule (0.25 mcg total) by mouth daily. 02/15/20 03/16/20  Alma Friendly, MD  ENDOCET 7.5-325 MG tablet Take 1-2 tablets by mouth 4 (four) times daily as needed. 02/08/20   [provider]  LORazepam (ATIVAN) 0.5 MG tablet Take 1-2 tablets by mouth at bedtime.   01/25/20   [provider]  nicotine (NICODERM CQ - DOSED IN MG/24 HOURS) 21 mg/24hr patch Place 1 patch (21 mg total) onto the skin daily. 02/14/20   Alma Friendly, MD  nitroGLYCERIN (NITROSTAT) 0.4 MG SL tablet Place 0.4 mg under the tongue every 5 (five) minutes as needed.  01/11/20   [provider]  ondansetron (ZOFRAN-ODT) 8 MG disintegrating tablet Take 8 mg by mouth every 6 (six) hours as needed.  01/04/20   [provider]  prochlorperazine (COMPAZINE) 5 MG tablet Take 5 mg by mouth every 6 (six) hours as needed.  01/01/20   [provider]  rosuvastatin (CRESTOR) 40 MG tablet Take 40 mg by mouth daily. 11/03/19   [provider]  sodium bicarbonate 650 MG tablet Take 2 tablets (1,300 mg total) by mouth 2 (two) times daily. 02/14/20 03/15/20  Alma Friendly, MD  Vitamin D, Ergocalciferol, (DRISDOL) 1.25 MG (50000 UNIT) CAPS capsule Take 50,000 Units by mouth once a week. 09/28/19   [provider]  XTAMPZA ER 13.5 MG C12A Take 1 capsule by mouth 2 (two) times daily. 12/19/19   [provider]    Allergies Patient has no known allergies.  No family history on file.  Social History Social History   Tobacco Use   Smoking status: Current Every Day Smoker   Smokeless tobacco: Never Used  Substance Use Topics   Alcohol use: Never   Drug use: Never    Review of Systems  EM caveat  ____________________________________________   PHYSICAL EXAM:  VITAL  SIGNS: ED Triage Vitals  Enc Vitals Group     BP 02/18/20 1956 (S) (!) 187/114     Pulse Rate 02/18/20 1956 (!) 128     Resp 02/18/20 1956 20     Temp 02/18/20 1956 97.8 F (36.6 C)     Temp Source 02/18/20 1956 Axillary     SpO2 02/18/20 1956 100 %     Weight 02/18/20 2002 158 lb 11.7 oz (72 kg)     Height 02/18/20 2002 6' (1.829 m)     Head Circumference --      Peak Flow --      Pain Score --      Pain Loc --      Pain Edu? --      Excl. in  [place]? --     Constitutional: Patient very somnolent, unable to assess orientation.  Will speak words occasionally that are nonsensical Eyes: Conjunctivae are mildly injected Head: Atraumatic. Nose: No congestion/rhinnorhea. Mouth/Throat: Mucous membranes are slightly dry. Neck: No stridor.  Cardiovascular: Normal rate, regular rhythm. Grossly normal heart sounds.  Good peripheral circulation. Respiratory: Normal respiratory effort.  No retractions. Lungs CTAB. Gastrointestinal: Soft and nontender. No distention. Musculoskeletal: No lower extremity tenderness nor edema noted.  No bruising contusion or injuries noted. Neurologic: Patient is somewhat agitated, speaking occasionally nonsensical words, primarily somnolent though and rolling about in the bed left to right and right to left.  No obvious deficits.  He does demonstrate normal strength and resist exam with all extremities.  There is no obvious facial droops, neurologic assessment is somewhat limited.  No asterixis is noted.  Skin:  Skin is warm, dry and intact. No rash noted. Psychiatric: Mood and affect are somewhat anxious, difficult to fully assess.  No report of intentional overdose, there is however report of concern for overdose potentially occurring ____________________________________________   LABS (all labs ordered are listed, but only abnormal results are displayed)  Labs Reviewed  COMPREHENSIVE METABOLIC PANEL - Abnormal; Notable for the following components:      Result Value   Chloride 112 (*)    CO2 19 (*)    Glucose, Bld 104 (*)    BUN 24 (*)    Creatinine, Ser 2.79 (*)    GFR, Estimated 23 (*)    All other components within normal limits  CBC WITH DIFFERENTIAL/PLATELET - Abnormal; Notable for the following components:   WBC 12.3 (*)    RBC 3.31 (*)    Hemoglobin 10.3 (*)    HCT 32.2 (*)    Neutro Abs 9.5 (*)    All other components within normal limits  URINALYSIS, COMPLETE (UACMP) WITH MICROSCOPIC -  Abnormal; Notable for the following components:   Color, Urine YELLOW (*)    APPearance CLEAR (*)    Hgb urine dipstick SMALL (*)    All other components within normal limits  URINE DRUG SCREEN, QUALITATIVE ([facility] ONLY) - Abnormal; Notable for the following components:   Opiate, Ur Screen POSITIVE (*)    Benzodiazepine, Ur Scrn POSITIVE (*)    All other components within normal limits  SALICYLATE LEVEL - Abnormal; Notable for the following components:   Salicylate Lvl <2.3 (*)    All other components within normal limits  ACETAMINOPHEN LEVEL - Abnormal; Notable for the following components:   Acetaminophen (Tylenol), Serum <10 (*)    All other components within normal limits  COOXEMETRY PANEL - Abnormal; Notable for the following components:   Carboxyhemoglobin 3.0 (*)  All other components within normal limits  CBG MONITORING, ED - Abnormal; Notable for the following components:   Glucose-Capillary 63 (*)    All other components within normal limits  CBG MONITORING, ED - Abnormal; Notable for the following components:   Glucose-Capillary 182 (*)    All other components within normal limits  TROPONIN I (HIGH SENSITIVITY) - Abnormal; Notable for the following components:   Troponin I (High Sensitivity) 19 (*)    All other components within normal limits  CULTURE, BLOOD (ROUTINE X 2)  CULTURE, BLOOD (ROUTINE X 2)  RESPIRATORY PANEL BY RT PCR (FLU A&B, COVID)  AMMONIA  LACTIC ACID, PLASMA  ETHANOL  PROTIME-INR  CK  URINALYSIS, ROUTINE W REFLEX MICROSCOPIC  CBG MONITORING, ED  CBG MONITORING, ED  CBG MONITORING, ED  CBG MONITORING, ED  CBG MONITORING, ED  CBG MONITORING, ED  CBG MONITORING, ED  CBG MONITORING, ED  CBG MONITORING, ED  CBG MONITORING, ED  CBG MONITORING, ED  CBG MONITORING, ED  CBG MONITORING, ED   ____________________________________________  EKG  Reviewed interpreted by me at 2210 Heart rate 80 QRS 110 QTc 450 031 sinus rhythm occasional sinus  arrhythmia.  T wave inversions to 3 and aVF.  No STEMI but do have concerns about inferior T wave abnormality including depressions ____________________________________________  RADIOLOGY  CT HEAD WO CONTRAST  Result Date: 02/18/2020 CLINICAL DATA:  69 year old male with delirium. EXAM: CT HEAD WITHOUT CONTRAST TECHNIQUE: Contiguous axial images were obtained from the base of the skull through the vertex without intravenous contrast. COMPARISON:  Head CT dated 12/12/2008. FINDINGS: Brain: The ventricles and sulci appropriate size for patient's age. The gray-white matter discrimination is preserved. There is no acute intracranial hemorrhage. No mass effect or midline shift. No extra-axial fluid collection. Vascular: No hyperdense vessel or unexpected calcification. Skull: Normal. Negative for fracture or focal lesion. Sinuses/Orbits: Mild mucoperiosteal thickening of paranasal sinuses. No air-fluid level. Bilateral mastoid effusions. Other: None IMPRESSION: 1. No acute intracranial pathology. 2. Bilateral mastoid effusions. Electronically Signed   By: Anner Crete M.D.   On: 02/18/2020 21:36   DG Chest Port 1 View  Result Date: 02/18/2020 CLINICAL DATA:  Altered level of consciousness EXAM: PORTABLE CHEST 1 VIEW COMPARISON:  08/31/2007 FINDINGS: The heart size and mediastinal contours are within normal limits. Both lungs are clear. The visualized skeletal structures are unremarkable. IMPRESSION: No active disease. Electronically Signed   By: Randa Ngo M.D.   On: 02/18/2020 21:20    CT head reviewed negative for acute intracranial finding.  Possible mastoid effusions bilateral  Chest x-ray reviewed no acute  ____________________________________________   PROCEDURES  Procedure(s) performed: None  Procedures  Critical Care performed: Yes, see critical care note(s)  CRITICAL CARE Performed by: Delman Kitten   Total critical care time: 30 minutes  Critical care time was exclusive  of separately billable procedures and treating other patients.  Critical care was necessary to treat or prevent imminent or life-threatening deterioration.  Critical care was time spent personally by me on the following activities: development of treatment plan with patient and/or surrogate as well as nursing, discussions with consultants, evaluation of patient's response to treatment, examination of patient, obtaining history from patient or surrogate, ordering and performing treatments and interventions, ordering and review of laboratory studies, ordering and review of radiographic studies, pulse oximetry and re-evaluation of patient's condition.  Patient presents with severe acute altered mental status/encephalopathic picture and what appears to be a report of possible polysubstance abuse.  His  wife presenting with similar symptoms as well found down at the same time both of which apparently received Narcan with improvement prehospital ____________________________________________   [place] / [place] / ED COURSE  Pertinent labs & imaging results that were available during my care of the patient were reviewed by me and considered in my medical decision making (see chart for details).   Patient has altered mental status, etiology somewhat unclear though polysubstance abuse or toxic metabolic is considered.  No report of suicidal ideation or suicide attempt, found down and there is report later from family to nurse that patient has morphine and lorazepam access and apparently also recently purchasing from a family member what is thought to be Xanax and fentanyl though this is not confirmed  Clinical Course as of Feb 17 2241  Nancy Fetter Feb 18, 2020  2034 Per RN Daughter reported morphine and lorazepam and recently met with a family daughter who apparently 'deals xanax laced with fentanyl'   [MQ]    Clinical Course User Index [MQ] Delman Kitten, MD    ----------------------------------------- 10:15 PM on 02/18/2020 -----------------------------------------  Patient is resting calmly, now purposely moves from one side the other laying on his back resting.  Vital signs improved including blood pressure.  He is hemodynamically stable but still with considerable alteration of mental status.  He has been able to purposely stand up and utilize the bathroom and go back to bed at this point, but he continues to have confusion encephalopathic picture.  Will admit to the hospital for further work-up.  Labs do reveal chronic renal disease drug screen positive for benzodiazepines and opioids, mild leukocytosis mild anemia, does have a history of known anemia salicylates and acetaminophen levels normal, carboxyhemoglobin level is normal.  Patient with altered mental status, unable to determine intent but felt based on the history provided likely recreational.  I have not placed the patient under IVC at this time as he does not obviously represent an acute intermediate risk to himself or others, but I do suspect he will need further evaluation and discussion to determine intent as well as potentially a psychiatric evaluation but at this time he is I have altered mental status and would not be able to participate meaningfully in a psychiatric consult  Patient require further admission for further work-up and evaluation, care and admission discussed with hospitalist Dr. Damita Dunnings at 10:40 PM  ____________________________________________   FINAL CLINICAL IMPRESSION(S) / ED DIAGNOSES  Final diagnoses:  Polysubstance abuse ([place])  Altered mental status, unspecified altered mental status type        Note:  This document was prepared using Dragon voice recognition software and may include unintentional dictation errors       Delman Kitten, MD 02/18/20 2243

## 2020-02-18 NOTE — ED Notes (Signed)
Pt became very aggressive, punching and kicking when a in/out attempted.

## 2020-02-18 NOTE — ED Notes (Signed)
Pt not responding to questions, but sat upright in bed, stating he needed to "go to the bathroom" . Pt moved himself off the stretcher and ambulated to the toilet. Urine sample collected.

## 2020-02-19 ENCOUNTER — Encounter: Payer: Self-pay | Admitting: Internal Medicine

## 2020-02-19 ENCOUNTER — Other Ambulatory Visit: Payer: Self-pay

## 2020-02-19 DIAGNOSIS — G894 Chronic pain syndrome: Secondary | ICD-10-CM | POA: Diagnosis present

## 2020-02-19 DIAGNOSIS — D62 Acute posthemorrhagic anemia: Secondary | ICD-10-CM | POA: Insufficient documentation

## 2020-02-19 DIAGNOSIS — N184 Chronic kidney disease, stage 4 (severe): Secondary | ICD-10-CM

## 2020-02-19 DIAGNOSIS — N186 End stage renal disease: Secondary | ICD-10-CM | POA: Diagnosis present

## 2020-02-19 DIAGNOSIS — F1910 Other psychoactive substance abuse, uncomplicated: Secondary | ICD-10-CM | POA: Diagnosis present

## 2020-02-19 DIAGNOSIS — T50901S Poisoning by unspecified drugs, medicaments and biological substances, accidental (unintentional), sequela: Secondary | ICD-10-CM | POA: Diagnosis not present

## 2020-02-19 DIAGNOSIS — G9341 Metabolic encephalopathy: Secondary | ICD-10-CM | POA: Diagnosis present

## 2020-02-19 DIAGNOSIS — F17210 Nicotine dependence, cigarettes, uncomplicated: Secondary | ICD-10-CM | POA: Diagnosis present

## 2020-02-19 DIAGNOSIS — Z9115 Patient's noncompliance with renal dialysis: Secondary | ICD-10-CM | POA: Diagnosis not present

## 2020-02-19 DIAGNOSIS — I10 Essential (primary) hypertension: Secondary | ICD-10-CM | POA: Diagnosis not present

## 2020-02-19 DIAGNOSIS — I120 Hypertensive chronic kidney disease with stage 5 chronic kidney disease or end stage renal disease: Secondary | ICD-10-CM | POA: Diagnosis present

## 2020-02-19 DIAGNOSIS — T40601A Poisoning by unspecified narcotics, accidental (unintentional), initial encounter: Secondary | ICD-10-CM | POA: Diagnosis present

## 2020-02-19 DIAGNOSIS — M549 Dorsalgia, unspecified: Secondary | ICD-10-CM | POA: Diagnosis present

## 2020-02-19 DIAGNOSIS — Z20822 Contact with and (suspected) exposure to covid-19: Secondary | ICD-10-CM | POA: Diagnosis present

## 2020-02-19 DIAGNOSIS — T50901A Poisoning by unspecified drugs, medicaments and biological substances, accidental (unintentional), initial encounter: Secondary | ICD-10-CM | POA: Diagnosis not present

## 2020-02-19 DIAGNOSIS — Y929 Unspecified place or not applicable: Secondary | ICD-10-CM | POA: Diagnosis not present

## 2020-02-19 DIAGNOSIS — Z79899 Other long term (current) drug therapy: Secondary | ICD-10-CM | POA: Diagnosis not present

## 2020-02-19 DIAGNOSIS — E785 Hyperlipidemia, unspecified: Secondary | ICD-10-CM | POA: Diagnosis present

## 2020-02-19 DIAGNOSIS — Z992 Dependence on renal dialysis: Secondary | ICD-10-CM | POA: Diagnosis not present

## 2020-02-19 DIAGNOSIS — F1123 Opioid dependence with withdrawal: Secondary | ICD-10-CM | POA: Diagnosis present

## 2020-02-19 DIAGNOSIS — R112 Nausea with vomiting, unspecified: Secondary | ICD-10-CM | POA: Diagnosis not present

## 2020-02-19 DIAGNOSIS — E860 Dehydration: Secondary | ICD-10-CM | POA: Diagnosis present

## 2020-02-19 LAB — RESPIRATORY PANEL BY RT PCR (FLU A&B, COVID)
Influenza A by PCR: NEGATIVE
Influenza B by PCR: NEGATIVE
SARS Coronavirus 2 by RT PCR: NEGATIVE

## 2020-02-19 LAB — CBG MONITORING, ED
Glucose-Capillary: 108 mg/dL — ABNORMAL HIGH (ref 70–99)
Glucose-Capillary: 106 mg/dL — ABNORMAL HIGH (ref 70–99)
Glucose-Capillary: 96 mg/dL (ref 70–99)

## 2020-02-19 MED ORDER — NITROGLYCERIN 0.4 MG SL SUBL: 0.4000 mg | SUBLINGUAL_TABLET | SUBLINGUAL | Status: DC | PRN

## 2020-02-19 MED ORDER — NICOTINE 21 MG/24HR TD PT24
21.0000 mg | MEDICATED_PATCH | Freq: Every day | TRANSDERMAL | Status: DC
  Administered 2020-02-19 – 2020-02-21 (×3): 21 mg via TRANSDERMAL
  Filled 2020-02-19 (×3): qty 1

## 2020-02-19 MED ORDER — PNEUMOCOCCAL VAC POLYVALENT 25 MCG/0.5ML IJ INJ
0.5000 mL | INJECTION | INTRAMUSCULAR | Status: DC
  Filled 2020-02-19: qty 0.5

## 2020-02-19 MED ORDER — DROPERIDOL 2.5 MG/ML IJ SOLN
2.5000 mg | Freq: Once | INTRAMUSCULAR | Status: AC
  Administered 2020-02-19: 2.5 mg via INTRAMUSCULAR

## 2020-02-19 MED ORDER — SODIUM BICARBONATE 650 MG PO TABS
1300.0000 mg | ORAL_TABLET | Freq: Two times a day (BID) | ORAL | Status: DC
  Administered 2020-02-19 – 2020-02-21 (×4): 1300 mg via ORAL
  Filled 2020-02-19 (×6): qty 2

## 2020-02-19 MED ORDER — DROPERIDOL 2.5 MG/ML IJ SOLN: 2.5000 mg | Freq: Once | INTRAMUSCULAR | Status: DC

## 2020-02-19 MED ORDER — AMLODIPINE BESYLATE 5 MG PO TABS
5.0000 mg | ORAL_TABLET | Freq: Every day | ORAL | Status: DC
  Administered 2020-02-20 – 2020-02-21 (×2): 5 mg via ORAL
  Filled 2020-02-19 (×2): qty 1

## 2020-02-19 MED ORDER — CALCITRIOL 0.25 MCG PO CAPS
0.2500 ug | ORAL_CAPSULE | Freq: Every day | ORAL | Status: DC
  Administered 2020-02-20 – 2020-02-21 (×2): 0.25 ug via ORAL
  Filled 2020-02-19 (×3): qty 1

## 2020-02-19 MED ORDER — DROPERIDOL 2.5 MG/ML IJ SOLN
2.5000 mg | Freq: Once | INTRAMUSCULAR | Status: DC
  Filled 2020-02-19: qty 2

## 2020-02-19 MED ORDER — INFLUENZA VAC A&B SA ADJ QUAD 0.5 ML IM PRSY
0.5000 mL | PREFILLED_SYRINGE | INTRAMUSCULAR | Status: DC
  Filled 2020-02-19: qty 0.5

## 2020-02-19 NOTE — ED Notes (Signed)
Assumed care of pt at 0300. Pt bed alarm in place, immediately crawled to end of bed before any staff could reach pt. RN got to patient before standing, however pt extremely agitated and attempting to fight staff. Demanding he needed to void despite attempts to use urinal and assist patient directly to toilet but patient remains too altered. Unable to understand he is at toilet when assisted there by 2 RNs and started voiding on the floor within the room. Pt IV infiltrated and had to be removed. Medicated for agitation. Awaiting medication affects to place new IV and continue IV NS infusion. Talking in small phrases and one word answers. Sitter placed at bedside for pt safety. Bed alarm in place. Yellow non-skid socks in place. Side rails up x2. Lower half of bed at incline with upper half decline to increase pt effort when he attempts to get out of bed. Fall risk bracelet in place. Charge RN and MD Damita Dunnings made aware of high fall risk.

## 2020-02-19 NOTE — ED Notes (Signed)
Pt resting at this time, 1:1 sitter at bedside for safety

## 2020-02-19 NOTE — ED Notes (Signed)
Pt responsive to verbal stimuli oriented to person only.
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
## 2020-02-19 NOTE — Progress Notes (Signed)
Pt refusing to keep telemetry leads on at this time

## 2020-02-19 NOTE — Progress Notes (Signed)
Patient ID: Jerry Galloway, male   DOB: [birth year], 69 y.o.   MRN: [id number] [facility] PROGRESS NOTE  Jerry Galloway VCB:[id number] DOB: November 12, 1950 DOA: 02/18/2020 PCP: Sherald Hess., MD  HPI/Subjective: Patient woke up and answer to question and then went back to sleep.  He was able to maneuver and move all of his extremities in the bed.  As per the daughter, they were visited by the niece and may have been given Xanax laced with fentanyl.  After patient moved in the bed and I examined him I covered him up with a blanket.  I asked the nursing staff to get him a pillow.  Objective: Vitals:   02/19/20 1145 02/19/20 1200  BP:  (!) 162/91  Pulse: 62 91  Resp:    Temp:    SpO2: 97% 100%    Intake/Output Summary (Last 24 hours) at 02/19/2020 1302 Last data filed at 02/19/2020 1038 Gross per 24 hour  Intake 0 ml  Output 150 ml  Net -150 ml   Filed Weights   02/18/20 2002  Weight: 72 kg    ROS: Review of Systems  Unable to perform ROS: Acuity of condition  Respiratory: Negative for shortness of breath.   Cardiovascular: Negative for chest pain.   Exam: Physical Exam HENT:     Head: Normocephalic.     Mouth/Throat:     Pharynx: No oropharyngeal exudate.  Eyes:     General: Lids are normal.     Conjunctiva/sclera: Conjunctivae normal.     Pupils: Pupils are equal, round, and reactive to light.  Cardiovascular:     Rate and Rhythm: Normal rate and regular rhythm.     Heart sounds: Normal heart sounds, S1 normal and S2 normal.  Pulmonary:     Effort: Pulmonary effort is normal.     Breath sounds: No decreased breath sounds, wheezing, rhonchi or rales.  Abdominal:     Palpations: Abdomen is soft.     Tenderness: There is no abdominal tenderness.  Musculoskeletal:     Right lower leg: No swelling.     Left lower leg: No swelling.  Skin:    General: Skin is warm.     Findings: No rash.  Neurological:     Mental Status: He is lethargic.      Comments: Patient moves all of his extremities on his own.       Data Reviewed: Basic Metabolic Panel: Recent Labs  Lab 02/12/20 1522 02/13/20 0405 02/14/20 0733 02/18/20 2015  NA 137 138 141 145  K 4.6 4.3 4.2 3.9  CL 108 111 114* 112*  CO2 20* 18* 16* 19*  GLUCOSE 138* 115* 98 104*  BUN 28* 23 21 24*  CREATININE 3.53* 3.11* 2.54* 2.79*  CALCIUM 8.9 8.5* 8.6* 9.4   Liver Function Tests: Recent Labs  Lab 02/18/20 2015  AST 16  ALT 8  ALKPHOS 78  BILITOT 1.0  PROT 7.9  ALBUMIN 3.9    Recent Labs  Lab 02/18/20 2037  AMMONIA 27   CBC: Recent Labs  Lab 02/12/20 1522 02/12/20 1522 02/13/20 0405 02/13/20 0924 02/13/20 1549 02/14/20 0733 02/18/20 2015  WBC 7.3  --  7.8  --   --  7.1 12.3*  NEUTROABS  --   --   --   --   --  4.6 9.5*  HGB 7.5*   < > 8.3* 8.9* 8.4* 8.5* 10.3*  HCT 23.7*   < > 26.2* 27.2* 25.6* 26.3* 32.2*  MCV 100.4*  --  96.7  --   --  96.7 97.3  PLT 298  --  279  --   --  279 335   < > = values in this interval not displayed.   Cardiac Enzymes: Recent Labs  Lab 02/18/20 2015  CKTOTAL 54   CBG: Recent Labs  Lab 02/18/20 2020 02/18/20 2144 02/19/20 0314 02/19/20 0440 02/19/20 0739  GLUCAP 63* 182* 108* 106* 96    Recent Results (from the past 240 hour(s))  Respiratory Panel by RT PCR (Flu A&B, Covid) - Nasopharyngeal Swab     Status: None   Collection Time: 02/12/20  9:09 PM   Specimen: Nasopharyngeal Swab  Result Value Ref Range Status   SARS Coronavirus 2 by RT PCR NEGATIVE NEGATIVE Final    Comment: (NOTE) SARS-CoV-2 target nucleic acids are NOT DETECTED.  The SARS-CoV-2 RNA is generally detectable in upper respiratoy specimens during the acute phase of infection. The lowest concentration of SARS-CoV-2 viral copies this assay can detect is 131 copies/mL. A negative result does not preclude SARS-Cov-2 infection and should not be used as the sole basis for treatment or other patient management decisions. A negative  result may occur with  improper specimen collection/handling, submission of specimen other than nasopharyngeal swab, presence of viral mutation(s) within the areas targeted by this assay, and inadequate number of viral copies (<131 copies/mL). A negative result must be combined with clinical observations, patient history, and epidemiological information. The expected result is Negative.  Fact Sheet for Patients:  PinkCheek.be  Fact Sheet for Healthcare Providers:  GravelBags.it  This test is no t yet approved or cleared by the [place] FDA and  has been authorized for detection and/or diagnosis of SARS-CoV-2 by FDA under an Emergency Use Authorization (EUA). This EUA will remain  in effect (meaning this test can be used) for the duration of the COVID-19 declaration under Section 564(b)(1) of the Act, 21 U.S.C. section 360bbb-3(b)(1), unless the authorization is terminated or revoked sooner.     Influenza A by PCR NEGATIVE NEGATIVE Final   Influenza B by PCR NEGATIVE NEGATIVE Final    Comment: (NOTE) The Xpert Xpress SARS-CoV-2/FLU/RSV assay is intended as an aid in  the diagnosis of influenza from Nasopharyngeal swab specimens and  should not be used as a sole basis for treatment. Nasal washings and  aspirates are unacceptable for Xpert Xpress SARS-CoV-2/FLU/RSV  testing.  Fact Sheet for Patients: PinkCheek.be  Fact Sheet for Healthcare Providers: GravelBags.it  This test is not yet approved or cleared by the [place] FDA and  has been authorized for detection and/or diagnosis of SARS-CoV-2 by  FDA under an Emergency Use Authorization (EUA). This EUA will remain  in effect (meaning this test can be used) for the duration of the  Covid-19 declaration under Section 564(b)(1) of the Act, 21  U.S.C. section 360bbb-3(b)(1), unless the authorization is   terminated or revoked. Performed at [facility], [address] [place], [place] [address]   Respiratory Panel by RT PCR (Flu A&B, Covid) - Nasopharyngeal Swab     Status: None   Collection Time: 02/18/20 12:56 AM   Specimen: Nasopharyngeal Swab  Result Value Ref Range Status   SARS Coronavirus 2 by RT PCR NEGATIVE NEGATIVE Final    Comment: (NOTE) SARS-CoV-2 target nucleic acids are NOT DETECTED.  The SARS-CoV-2 RNA is generally detectable in upper respiratoy specimens during the acute phase of infection. The lowest concentration of SARS-CoV-2 viral copies this  assay can detect is 131 copies/mL. A negative result does not preclude SARS-Cov-2 infection and should not be used as the sole basis for treatment or other patient management decisions. A negative result may occur with  improper specimen collection/handling, submission of specimen other than nasopharyngeal swab, presence of viral mutation(s) within the areas targeted by this assay, and inadequate number of viral copies (<131 copies/mL). A negative result must be combined with clinical observations, patient history, and epidemiological information. The expected result is Negative.  Fact Sheet for Patients:  PinkCheek.be  Fact Sheet for Healthcare Providers:  GravelBags.it  This test is no t yet approved or cleared by the [place] FDA and  has been authorized for detection and/or diagnosis of SARS-CoV-2 by FDA under an Emergency Use Authorization (EUA). This EUA will remain  in effect (meaning this test can be used) for the duration of the COVID-19 declaration under Section 564(b)(1) of the Act, 21 U.S.C. section 360bbb-3(b)(1), unless the authorization is terminated or revoked sooner.     Influenza A by PCR NEGATIVE NEGATIVE Final   Influenza B by PCR NEGATIVE NEGATIVE Final    Comment: (NOTE) The Xpert Xpress SARS-CoV-2/FLU/RSV assay is  intended as an aid in  the diagnosis of influenza from Nasopharyngeal swab specimens and  should not be used as a sole basis for treatment. Nasal washings and  aspirates are unacceptable for Xpert Xpress SARS-CoV-2/FLU/RSV  testing.  Fact Sheet for Patients: PinkCheek.be  Fact Sheet for Healthcare Providers: GravelBags.it  This test is not yet approved or cleared by the [place] FDA and  has been authorized for detection and/or diagnosis of SARS-CoV-2 by  FDA under an Emergency Use Authorization (EUA). This EUA will remain  in effect (meaning this test can be used) for the duration of the  Covid-19 declaration under Section 564(b)(1) of the Act, 21  U.S.C. section 360bbb-3(b)(1), unless the authorization is  terminated or revoked. Performed at [facility], [address] [place], [place] [address]   Blood Cultures (routine x 2)     Status: None (Preliminary result)   Collection Time: 02/18/20  8:15 PM   Specimen: BLOOD LEFT FOREARM  Result Value Ref Range Status   Specimen Description BLOOD LEFT FOREARM  Final   Special Requests   Final    BOTTLES DRAWN AEROBIC AND ANAEROBIC Blood Culture results may not be optimal due to an excessive volume of blood received in culture bottles   Culture   Final    NO GROWTH < 12 HOURS Performed at [facility], [address], [place], [place] [address]    Report Status PENDING  Incomplete     Studies: CT HEAD WO CONTRAST  Result Date: 02/18/2020 CLINICAL DATA:  70 year old male with delirium. EXAM: CT HEAD WITHOUT CONTRAST TECHNIQUE: Contiguous axial images were obtained from the base of the skull through the vertex without intravenous contrast. COMPARISON:  Head CT dated 12/12/2008. FINDINGS: Brain: The ventricles and sulci appropriate size for patient's age. The gray-white matter discrimination is preserved. There is no acute intracranial hemorrhage. No  mass effect or midline shift. No extra-axial fluid collection. Vascular: No hyperdense vessel or unexpected calcification. Skull: Normal. Negative for fracture or focal lesion. Sinuses/Orbits: Mild mucoperiosteal thickening of paranasal sinuses. No air-fluid level. Bilateral mastoid effusions. Other: None IMPRESSION: 1. No acute intracranial pathology. 2. Bilateral mastoid effusions. Electronically Signed   By: Anner Crete M.D.   On: 02/18/2020 21:36   DG Chest Port 1 View  Result Date:  02/18/2020 CLINICAL DATA:  Altered level of consciousness EXAM: PORTABLE CHEST 1 VIEW COMPARISON:  08/31/2007 FINDINGS: The heart size and mediastinal contours are within normal limits. Both lungs are clear. The visualized skeletal structures are unremarkable. IMPRESSION: No active disease. Electronically Signed   By: Randa Ngo M.D.   On: 02/18/2020 21:20    Scheduled Meds: . amLODipine  5 mg Oral Daily  . calcitRIOL  0.25 mcg Oral Daily  . droperidol  2.5 mg Intravenous Once  . enoxaparin (LOVENOX) injection  30 mg Subcutaneous Daily  . nicotine  21 mg Transdermal Daily  . sodium bicarbonate  1,300 mg Oral BID   Continuous Infusions: . sodium chloride 50 mL/hr at 02/19/20 0826    Assessment/Plan:  1. Acute metabolic encephalopathy.  Continue to hold any medications that can cause altered mental status including Ativan, Endocet, XTampza and Butrans. 2. Drug overdose.  Likely Xanax laced with fentanyl.  Continue to monitor.  Psychiatric consultation.  Likely not intentional. 3. Essential hypertension on amlodipine 4. Hyperlipidemia unspecified hold Crestor at this point 5. Chronic kidney disease stage IV.  Continue to monitor 6. Recent admission for acute blood loss anemia.  Today's hemoglobin 10.3 which is higher than previous.  Likely more elevated with dehydration.  Gentle fluids. 7. Chronic pain syndrome continue to hold chronic pain medications      Code Status:     Code Status Orders   (From admission, onward)         Start     Ordered   02/18/20 2302  Full code  Continuous        02/18/20 2302        Code Status History    Date Active Date Inactive Code Status Order ID Comments User Context   02/12/2020 2152 02/14/2020 2013 Full Code [id number]  Mansy, Arvella Merles, MD ED   Advance Care Planning Activity     Family Communication: Spoke with daughter on the phone Disposition Plan: Status is: Observation  Dispo: The patient is from: Home              Anticipated d/c is to: Home              Anticipated d/c date is: Reevaluate tomorrow we will have to wake up prior to disposition              Patient currently seen for acute metabolic encephalopathy likely from drug overdose.  Reassess tomorrow.  Consultants:  Psychiatry  Time spent: 32 minutes  [place]

## 2020-02-19 NOTE — ED Provider Notes (Signed)
-----------------------------------------   3:05 AM on 02/19/2020 -----------------------------------------  Patient becoming increasingly agitated and aggressive, standing up naked in his room and asking to be taken to the bathroom when he is standing above the toilet.  He is clearly confused and represents safety issue to himself and staff.  We will medicate with IV droperidol as his QTC is within normal limits.   Blake Divine, MD 02/19/20 [id number]

## 2020-02-19 NOTE — Progress Notes (Signed)
Pt transferred to room 37, pt able to scoot from stretcher to bed without assistance. When pt asked if he could tell the RN his name he responded with "yep" but pt would not tell name. Pt request light off and to be covered. Sitter at bedside

## 2020-02-19 NOTE — Progress Notes (Signed)
Report given to [place] on [place]

## 2020-02-19 NOTE — Progress Notes (Signed)
PHARMACIST - PHYSICIAN COMMUNICATION  CONCERNING:  Enoxaparin (Lovenox) for DVT Prophylaxis    RECOMMENDATION: Patient was prescribed enoxaprin 40mg  q24 hours for VTE prophylaxis.   Filed Weights   02/18/20 2002  Weight: 72 kg (158 lb 11.7 oz)    Body mass index is 21.53 kg/m.  Estimated Creatinine Clearance: 24.7 mL/min (A) (by C-G formula based on SCr of 2.79 mg/dL (H)).   Patient is candidate for enoxaparin 30mg  every 24 hours based on CrCl <72ml/min or Weight <45kg  DESCRIPTION: Pharmacy has adjusted enoxaparin dose per [facility] policy.  Patient is now receiving enoxaparin 30 mg every 24 hours    Ena Dawley, PharmD Clinical Pharmacist  02/19/2020 7:24 AM

## 2020-02-19 NOTE — ED Notes (Addendum)
Pt will not keep tele leads in place, sitter at bedside for pt safety

## 2020-02-19 NOTE — Consult Note (Signed)
[facility] Consult   Reason for Consult: Consult for 69 year old man brought to the hospital with altered mental status and suspected overdose Referring Physician: Bobbye Charleston Patient Identification: Jerry Galloway MRN:  [id number] Principal Diagnosis: Acute metabolic encephalopathy Diagnosis:  Principal Problem:   Acute metabolic encephalopathy Active Problems:   Essential hypertension   Chronic pain syndrome   Accidental drug overdose   CKD (chronic kidney disease), stage IV ([place])   Total Time spent with patient: 1 hour  Subjective:   Jerry Galloway is a 69 y.o. male patient admitted with patient not communicating.  HPI: Patient seen chart reviewed.  69 year old man brought to the emergency room by EMS after they were called by family.  Family member, presumably daughter, called 911 after finding this patient as well as the patient's wife with altered mental status at their home.  Reportedly she administered Narcan with improvement in her mental state.  Patient was reportedly agitated and aggressive intermittently on his way into the hospital.  On evaluation today I found him lying in a stretcher in the emergency room.  He initially responded to me by acknowledging his name and answered a few questions superficially with yeses and noes nodded to me but would not provide any information about why he was in the hospital or any description of what it been going on recently or answer any probing questions about medical or psychiatric needs.  I spoke with the patient's daughter who reports that it is her belief that this patient as well as the patient's wife had been given illicit narcotics intentionally.  It does not however appear that there is any proof of that.  There does seem to be evidence that narcotic overdose was involved and that it may have been combined with benzodiazepine overdose.  Patient is prescribed chronic pain medication both short acting and long-acting.   Additionally patient has a history of end-stage renal disease and has refused dialysis and is reportedly at the hospice level of care.  Nevertheless the daughter does not report knowing of any suicidal expression from this patient at any time.  Past Psychiatric History: No known past psychiatric history.  No known past suicide attempts.  No mental health history documented  Risk to Self:   Risk to Others:   Prior Inpatient Therapy:   Prior Outpatient Therapy:    Past Medical History:  Past Medical History:  Diagnosis Date  . Chronic pain   . CKD (chronic kidney disease)   . HTN (hypertension)    History reviewed. No pertinent surgical history. Family History: History reviewed. No pertinent family history. Family Psychiatric  History: None reported except for a granddaughter with a substance abuse problem Social History:  Social History   Substance and Sexual Activity  Alcohol Use Never     Social History   Substance and Sexual Activity  Drug Use Never    Social History   Socioeconomic History  . Marital status: Married    Spouse name: Not on file  . Number of children: Not on file  . Years of education: Not on file  . Highest education level: Not on file  Occupational History  . Not on file  Tobacco Use  . Smoking status: Current Every Day Smoker  . Smokeless tobacco: Never Used  Substance and Sexual Activity  . Alcohol use: Never  . Drug use: Never  . Sexual activity: Not on file  Other Topics Concern  . Not on file  Social History Narrative  .  Not on file   Social Determinants of Health   Financial Resource Strain:   . Difficulty of Paying Living Expenses: Not on file  Food Insecurity:   . Worried About Charity fundraiser in the Last Year: Not on file  . Ran Out of Food in the Last Year: Not on file  Transportation Needs:   . Lack of Transportation (Medical): Not on file  . Lack of Transportation (Non-Medical): Not on file  Physical Activity:   . Days  of Exercise per Week: Not on file  . Minutes of Exercise per Session: Not on file  Stress:   . Feeling of Stress : Not on file  Social Connections:   . Frequency of Communication with Friends and Family: Not on file  . Frequency of Social Gatherings with Friends and Family: Not on file  . Attends Religious Services: Not on file  . Active Member of Clubs or Organizations: Not on file  . Attends Archivist Meetings: Not on file  . Marital Status: Not on file   Additional Social History:    Allergies:  No Known Allergies  Labs:  Results for orders placed or performed during the hospital encounter of 02/18/20 (from the past 48 hour(s))  Respiratory Panel by RT PCR (Flu A&B, Covid) - Nasopharyngeal Swab     Status: None   Collection Time: 02/18/20 12:56 AM   Specimen: Nasopharyngeal Swab  Result Value Ref Range   SARS Coronavirus 2 by RT PCR NEGATIVE NEGATIVE    Comment: (NOTE) SARS-CoV-2 target nucleic acids are NOT DETECTED.  The SARS-CoV-2 RNA is generally detectable in upper respiratoy specimens during the acute phase of infection. The lowest concentration of SARS-CoV-2 viral copies this assay can detect is 131 copies/mL. A negative result does not preclude SARS-Cov-2 infection and should not be used as the sole basis for treatment or other patient management decisions. A negative result may occur with  improper specimen collection/handling, submission of specimen other than nasopharyngeal swab, presence of viral mutation(s) within the areas targeted by this assay, and inadequate number of viral copies (<131 copies/mL). A negative result must be combined with clinical observations, patient history, and epidemiological information. The expected result is Negative.  Fact Sheet for Patients:  PinkCheek.be  Fact Sheet for Healthcare Providers:  GravelBags.it  This test is no t yet approved or cleared by the  [place] FDA and  has been authorized for detection and/or diagnosis of SARS-CoV-2 by FDA under an Emergency Use Authorization (EUA). This EUA will remain  in effect (meaning this test can be used) for the duration of the COVID-19 declaration under Section 564(b)(1) of the Act, 21 U.S.C. section 360bbb-3(b)(1), unless the authorization is terminated or revoked sooner.     Influenza A by PCR NEGATIVE NEGATIVE   Influenza B by PCR NEGATIVE NEGATIVE    Comment: (NOTE) The Xpert Xpress SARS-CoV-2/FLU/RSV assay is intended as an aid in  the diagnosis of influenza from Nasopharyngeal swab specimens and  should not be used as a sole basis for treatment. Nasal washings and  aspirates are unacceptable for Xpert Xpress SARS-CoV-2/FLU/RSV  testing.  Fact Sheet for Patients: PinkCheek.be  Fact Sheet for Healthcare Providers: GravelBags.it  This test is not yet approved or cleared by the [place] FDA and  has been authorized for detection and/or diagnosis of SARS-CoV-2 by  FDA under an Emergency Use Authorization (EUA). This EUA will remain  in effect (meaning this test can be used) for the duration  of the  Covid-19 declaration under Section 564(b)(1) of the Act, 21  U.S.C. section 360bbb-3(b)(1), unless the authorization is  terminated or revoked. Performed at [facility], [address] [place], [place] [address]   Comprehensive metabolic panel     Status: Abnormal   Collection Time: 02/18/20  8:15 PM  Result Value Ref Range   Sodium 145 135 - 145 mmol/L   Potassium 3.9 3.5 - 5.1 mmol/L   Chloride 112 (H) 98 - 111 mmol/L   CO2 19 (L) 22 - 32 mmol/L   Glucose, Bld 104 (H) 70 - 99 mg/dL    Comment: Glucose reference range applies only to samples taken after fasting for at least 8 hours.   BUN 24 (H) 8 - 23 mg/dL   Creatinine, Ser 2.79 (H) 0.61 - 1.24 mg/dL   Calcium 9.4 8.9 - 10.3 mg/dL   Total Protein 7.9  6.5 - 8.1 g/dL   Albumin 3.9 3.5 - 5.0 g/dL   AST 16 15 - 41 U/L   ALT 8 0 - 44 U/L   Alkaline Phosphatase 78 38 - 126 U/L   Total Bilirubin 1.0 0.3 - 1.2 mg/dL   GFR, Estimated 23 (L) >60 mL/min    Comment: (NOTE) Calculated using the CKD-EPI Creatinine Equation (2021)    Anion gap 14 5 - 15    Comment: Performed at [facility], [address] [place], [place] [address]  CBC WITH DIFFERENTIAL     Status: Abnormal   Collection Time: 02/18/20  8:15 PM  Result Value Ref Range   WBC 12.3 (H) 4.0 - 10.5 K/uL   RBC 3.31 (L) 4.22 - 5.81 MIL/uL   Hemoglobin 10.3 (L) 13.0 - 17.0 g/dL   HCT 32.2 (L) 39 - 52 %   MCV 97.3 80.0 - 100.0 fL   MCH 31.1 26.0 - 34.0 pg   MCHC 32.0 30.0 - 36.0 g/dL   RDW 14.2 11.5 - 15.5 %   Platelets 335 150 - 400 K/uL   nRBC 0.0 0.0 - 0.2 %   Neutrophils Relative % 76 %   Neutro Abs 9.5 (H) 1.7 - 7.7 K/uL   Lymphocytes Relative 16 %   Lymphs Abs 1.9 0.7 - 4.0 K/uL   Monocytes Relative 3 %   Monocytes Absolute 0.3 0.1 - 1.0 K/uL   Eosinophils Relative 4 %   Eosinophils Absolute 0.5 0.0 - 0.5 K/uL   Basophils Relative 1 %   Basophils Absolute 0.1 0.0 - 0.1 K/uL   Immature Granulocytes 0 %   Abs Immature Granulocytes 0.03 0.00 - 0.07 K/uL    Comment: Performed at [facility], [address] [place], [place] [address]  Lactic acid, plasma     Status: None   Collection Time: 02/18/20  8:15 PM  Result Value Ref Range   Lactic Acid, Venous 1.7 0.5 - 1.9 mmol/L    Comment: Performed at [facility], [address] [place], [place] [address]  Ethanol     Status: None   Collection Time: 02/18/20  8:15 PM  Result Value Ref Range   Alcohol, Ethyl (B) <10 <10 mg/dL    Comment: (NOTE) Lowest detectable limit for serum alcohol is 10 mg/dL.  For medical purposes only. Performed at [facility], [address] [place], [place] [address]   Blood Cultures (routine x 2)     Status: None (Preliminary result)    Collection Time: 02/18/20  8:15 PM   Specimen: BLOOD  LEFT FOREARM  Result Value Ref Range   Specimen Description BLOOD LEFT FOREARM    Special Requests      BOTTLES DRAWN AEROBIC AND ANAEROBIC Blood Culture results may not be optimal due to an excessive volume of blood received in culture bottles   Culture      NO GROWTH < 12 HOURS Performed at [facility], [address] [place], [place] [address]    Report Status PENDING   Protime-INR     Status: None   Collection Time: 02/18/20  8:15 PM  Result Value Ref Range   Prothrombin Time 13.0 11.4 - 15.2 seconds   INR 1.0 0.8 - 1.2    Comment: (NOTE) INR goal varies based on device and disease states. Performed at [facility], [address] [place], [place] [address]   CK     Status: None   Collection Time: 02/18/20  8:15 PM  Result Value Ref Range   Total CK 54 49.0 - 397.0 U/L    Comment: Performed at [facility], [address] [place], [place] [address]  Salicylate level     Status: Abnormal   Collection Time: 02/18/20  8:15 PM  Result Value Ref Range   Salicylate Lvl <0.8 (L) 7.0 - 30.0 mg/dL    Comment: Performed at [facility], [address] [place], [place] [address]  Acetaminophen level     Status: Abnormal   Collection Time: 02/18/20  8:15 PM  Result Value Ref Range   Acetaminophen (Tylenol), Serum <10 (L) 10 - 30 ug/mL    Comment: (NOTE) Therapeutic concentrations vary significantly. A range of 10-30 ug/mL  may be an effective concentration for many patients. However, some  are best treated at concentrations outside of this range. Acetaminophen concentrations >150 ug/mL at 4 hours after ingestion  and >50 ug/mL at 12 hours after ingestion are often associated with  toxic reactions.  Performed at [facility], [place], [place] [address]   Troponin I (High Sensitivity)     Status: Abnormal   Collection Time: 02/18/20  8:15 PM  Result Value  Ref Range   Troponin I (High Sensitivity) 19 (H) <18 ng/L    Comment: (NOTE) Elevated high sensitivity troponin I (hsTnI) values and significant  changes across serial measurements may suggest ACS but many other  chronic and acute conditions are known to elevate hsTnI results.  Refer to the "Links" section for chest pain algorithms and additional  guidance. Performed at [facility], [address] [place], [place] [address]   POC CBG, ED     Status: Abnormal   Collection Time: 02/18/20  8:20 PM  Result Value Ref Range   Glucose-Capillary 63 (L) 70 - 99 mg/dL    Comment: Glucose reference range applies only to samples taken after fasting for at least 8 hours.  Cooxemetry Panel, hospital-performed (carboxy, met, total hgb, O2 sat)     Status: Abnormal (Preliminary result)   Collection Time: 02/18/20  8:25 PM  Result Value Ref Range   O2 Saturation PENDING %   Carboxyhemoglobin 3.0 (H) 0.5 - 1.5 %   Methemoglobin 0.2 0.0 - 1.5 %    Comment: Performed at [facility], [address] [place], [place] [address]   Total oxygen content PENDING 15.0 - 23.0 mL/dL  Ammonia     Status: None   Collection Time: 02/18/20  8:37 PM  Result Value Ref Range   Ammonia 27 9 -  35 umol/L    Comment: Performed at [facility], [address] [place], [place] [address]  Urinalysis, Complete w Microscopic     Status: Abnormal   Collection Time: 02/18/20  9:03 PM  Result Value Ref Range   Color, Urine YELLOW (A) YELLOW   APPearance CLEAR (A) CLEAR   Specific Gravity, Urine 1.008 1.005 - 1.030   pH 5.0 5.0 - 8.0   Glucose, UA NEGATIVE NEGATIVE mg/dL   Hgb urine dipstick SMALL (A) NEGATIVE   Bilirubin Urine NEGATIVE NEGATIVE   Ketones, ur NEGATIVE NEGATIVE mg/dL   Protein, ur NEGATIVE NEGATIVE mg/dL   Nitrite NEGATIVE NEGATIVE   Leukocytes,Ua NEGATIVE NEGATIVE   RBC / HPF 0-5 0 - 5 RBC/hpf   WBC, UA 0-5 0 - 5 WBC/hpf   Bacteria, UA NONE SEEN NONE SEEN   Squamous  Epithelial / LPF NONE SEEN 0 - 5    Comment: Performed at [facility], [address], [place], [place] [address]  Urine Drug Screen, Qualitative     Status: Abnormal   Collection Time: 02/18/20  9:03 PM  Result Value Ref Range   Tricyclic, Ur Screen NONE DETECTED NONE DETECTED   Amphetamines, Ur Screen NONE DETECTED NONE DETECTED   MDMA (Ecstasy)Ur Screen NONE DETECTED NONE DETECTED   Cocaine Metabolite,Ur [place] NONE DETECTED NONE DETECTED   Opiate, Ur Screen POSITIVE (A) NONE DETECTED   Phencyclidine (PCP) Ur S NONE DETECTED NONE DETECTED   Cannabinoid 50 Ng, Ur [place] NONE DETECTED NONE DETECTED   Barbiturates, Ur Screen NONE DETECTED NONE DETECTED   Benzodiazepine, Ur Scrn POSITIVE (A) NONE DETECTED   Methadone Scn, Ur NONE DETECTED NONE DETECTED    Comment: (NOTE) Tricyclics + metabolites, urine    Cutoff 1000 ng/mL Amphetamines + metabolites, urine  Cutoff 1000 ng/mL MDMA (Ecstasy), urine              Cutoff 500 ng/mL Cocaine Metabolite, urine          Cutoff 300 ng/mL Opiate + metabolites, urine        Cutoff 300 ng/mL Phencyclidine (PCP), urine         Cutoff 25 ng/mL Cannabinoid, urine                 Cutoff 50 ng/mL Barbiturates + metabolites, urine  Cutoff 200 ng/mL Benzodiazepine, urine              Cutoff 200 ng/mL Methadone, urine                   Cutoff 300 ng/mL  The urine drug screen provides only a preliminary, unconfirmed analytical test result and should not be used for non-medical purposes. Clinical consideration and professional judgment should be applied to any positive drug screen result due to possible interfering substances. A more specific alternate chemical method must be used in order to obtain a confirmed analytical result. Gas chromatography / mass spectrometry (GC/MS) is the preferred confirm atory method. Performed at [facility], [address] [place], [place] [address]   CBG monitoring, ED     Status: Abnormal   Collection  Time: 02/18/20  9:44 PM  Result Value Ref Range   Glucose-Capillary 182 (H) 70 - 99 mg/dL    Comment: Glucose reference range applies only to samples taken after fasting for at least 8 hours.  CBG monitoring, ED     Status: Abnormal   Collection Time: 02/19/20  3:14 AM  Result Value Ref Range  Glucose-Capillary 108 (H) 70 - 99 mg/dL    Comment: Glucose reference range applies only to samples taken after fasting for at least 8 hours.   Comment 1 QC Due   CBG monitoring, ED     Status: Abnormal   Collection Time: 02/19/20  4:40 AM  Result Value Ref Range   Glucose-Capillary 106 (H) 70 - 99 mg/dL    Comment: Glucose reference range applies only to samples taken after fasting for at least 8 hours.  CBG monitoring, ED     Status: None   Collection Time: 02/19/20  7:39 AM  Result Value Ref Range   Glucose-Capillary 96 70 - 99 mg/dL    Comment: Glucose reference range applies only to samples taken after fasting for at least 8 hours.    Current Facility-Administered Medications  Medication Dose Route Frequency Provider Last Rate Last Admin  . 0.9 %  sodium chloride infusion   Intravenous Continuous Loletha Grayer, MD 50 mL/hr at 02/19/20 0826 New Bag at 02/19/20 0826  . amLODipine (NORVASC) tablet 5 mg  5 mg Oral Daily Wieting, Richard, MD      . calcitRIOL (ROCALTROL) capsule 0.25 mcg  0.25 mcg Oral Daily Wieting, Richard, MD      . droperidol (INAPSINE) 2.5 MG/ML injection 2.5 mg  2.5 mg Intravenous Once Blake Divine, MD      . enoxaparin (LOVENOX) injection 30 mg  30 mg Subcutaneous Daily Judd Gaudier V, MD   30 mg at 02/19/20 1041  . naloxone [facility]) injection 0.4 mg  0.4 mg Intravenous PRN Athena Masse, MD      . nicotine (NICODERM CQ - dosed in mg/24 hours) patch 21 mg  21 mg Transdermal Daily Loletha Grayer, MD   21 mg at 02/19/20 1040  . nitroGLYCERIN (NITROSTAT) SL tablet 0.4 mg  0.4 mg Sublingual Q5 min PRN Loletha Grayer, MD      . ondansetron [facility]) tablet 4 mg  4  mg Oral Q6H PRN Athena Masse, MD       Or  . ondansetron [facility]) injection 4 mg  4 mg Intravenous Q6H PRN Athena Masse, MD      . sodium bicarbonate tablet 1,300 mg  1,300 mg Oral BID Loletha Grayer, MD       Current Outpatient Medications  Medication Sig Dispense Refill  . amLODipine (NORVASC) 5 MG tablet Take 1 tablet (5 mg total) by mouth daily. 30 tablet 0  . buprenorphine (BUTRANS) 15 MCG/HR 1 patch once a week.    . calcitRIOL (ROCALTROL) 0.25 MCG capsule Take 1 capsule (0.25 mcg total) by mouth daily. 30 capsule 0  . ENDOCET 7.5-325 MG tablet Take 1-2 tablets by mouth 4 (four) times daily as needed.    Marland Kitchen LORazepam (ATIVAN) 0.5 MG tablet Take 1-2 tablets by mouth at bedtime.     . nicotine (NICODERM CQ - DOSED IN MG/24 HOURS) 21 mg/24hr patch Place 1 patch (21 mg total) onto the skin daily. 28 patch 0  . nitroGLYCERIN (NITROSTAT) 0.4 MG SL tablet Place 0.4 mg under the tongue every 5 (five) minutes as needed.     . ondansetron (ZOFRAN-ODT) 8 MG disintegrating tablet Take 8 mg by mouth every 6 (six) hours as needed.     . prochlorperazine (COMPAZINE) 5 MG tablet Take 5 mg by mouth every 6 (six) hours as needed.     . rosuvastatin (CRESTOR) 40 MG tablet Take 40 mg by mouth daily.    . sodium bicarbonate  650 MG tablet Take 2 tablets (1,300 mg total) by mouth 2 (two) times daily. 120 tablet 0  . Vitamin D, Ergocalciferol, (DRISDOL) 1.25 MG (50000 UNIT) CAPS capsule Take 50,000 Units by mouth once a week.    Marland Kitchen XTAMPZA ER 13.5 MG C12A Take 1 capsule by mouth 2 (two) times daily.      Musculoskeletal: Strength & Muscle Tone: within normal limits Gait & Station: unable to stand Patient leans: N/A  Psychiatric Specialty Exam: Physical Exam Vitals and nursing note reviewed.  Constitutional:      Appearance: He is well-developed.  HENT:     Head: Normocephalic and atraumatic.  Eyes:     Conjunctiva/sclera: Conjunctivae normal.     Pupils: Pupils are equal, round, and reactive  to light.  Cardiovascular:     Heart sounds: Normal heart sounds.  Pulmonary:     Effort: Pulmonary effort is normal.  Abdominal:     Palpations: Abdomen is soft.  Musculoskeletal:        General: Normal range of motion.     Cervical back: Normal range of motion.  Skin:    General: Skin is warm and dry.  Neurological:     General: No focal deficit present.  Psychiatric:        Speech: He is noncommunicative.     Review of Systems  Unable to perform ROS: Patient unresponsive    Blood pressure (!) 162/91, pulse 91, temperature 97.8 F (36.6 C), temperature source Axillary, resp. rate 20, height 6' (1.829 m), weight 72 kg, SpO2 100 %.Body mass index is 21.53 kg/m.  General Appearance: Casual  Eye Contact:  None  Speech:  Slow  Volume:  Decreased  Mood:  Negative  Affect:  Negative  Thought Process:  NA  Orientation:  Negative  Thought Content:  Negative  Suicidal Thoughts:  Unknown  Homicidal Thoughts:  Unknown  Memory:  Immediate;   Negative Recent;   Negative Remote;   Negative  Judgement:  Fair  Insight:  Negative  Psychomotor Activity:  Negative  Concentration:  Concentration: Negative  Recall:  Negative  Fund of Knowledge:  Negative  Language:  Negative  Akathisia:  Negative  Handed:  Right  AIMS (if indicated):     Assets:  Social Support  ADL's:  Impaired  Cognition:  Impaired,  Severe  Sleep:        Treatment Plan Summary: Plan  Patient with altered mental status which appears from the evidence to most likely be due to overdose of narcotics or combination of narcotics and benzodiazepines.  Unclear if this was intentional or accidental and whether it was part of a substance abuse problem or not.  No psychiatric medicines ordered.  We will continue to follow up.  Continue IVC for now as patient has not been able to convincingly display that he is not dangerous to himself and has been agitated intermittently.  Disposition: Supportive therapy provided about  ongoing stressors.  Alethia Berthold, MD 02/19/2020 1:37 PM

## 2020-02-19 NOTE — Progress Notes (Signed)
Attempted report x1 - floor stated they needed to check with charge RN.

## 2020-02-19 NOTE — ED Notes (Signed)
Received a phone call from Dyane Dustman, daughter of this pt. She was the person who administered the Narcan on scene. She stated that there was a definite change in both her parents following the Narcan. Ms. Martha Clan was concerned that her mother did not test positive for opiates. Ms. Martha Clan expressed concern that a family member may have given Fentanyl to both. This nurse explained that Fentanyl does not show up on a standard urine drug screen. Dr. Jacqualine Code was consulted and stated that he would be comfortable in ordering the Fentanyl urine drug screen, Ms. Martha Clan was called back and informed that the test would be done on both parents, and we did not have a time frame for the results.

## 2020-02-20 DIAGNOSIS — T50901S Poisoning by unspecified drugs, medicaments and biological substances, accidental (unintentional), sequela: Secondary | ICD-10-CM

## 2020-02-20 DIAGNOSIS — G894 Chronic pain syndrome: Secondary | ICD-10-CM

## 2020-02-20 DIAGNOSIS — E785 Hyperlipidemia, unspecified: Secondary | ICD-10-CM

## 2020-02-20 DIAGNOSIS — R112 Nausea with vomiting, unspecified: Secondary | ICD-10-CM

## 2020-02-20 DIAGNOSIS — N184 Chronic kidney disease, stage 4 (severe): Secondary | ICD-10-CM

## 2020-02-20 DIAGNOSIS — I10 Essential (primary) hypertension: Secondary | ICD-10-CM

## 2020-02-20 LAB — BASIC METABOLIC PANEL
Anion gap: 15 (ref 5–15)
BUN: 26 mg/dL — ABNORMAL HIGH (ref 8–23)
CO2: 19 mmol/L — ABNORMAL LOW (ref 22–32)
Calcium: 9.2 mg/dL (ref 8.9–10.3)
Chloride: 108 mmol/L (ref 98–111)
Creatinine, Ser: 2.36 mg/dL — ABNORMAL HIGH (ref 0.61–1.24)
GFR, Estimated: 29 mL/min — ABNORMAL LOW (ref >60)
Glucose, Bld: 157 mg/dL — ABNORMAL HIGH (ref 70–99)
Potassium: 3.9 mmol/L (ref 3.5–5.1)
Sodium: 142 mmol/L (ref 135–145)

## 2020-02-20 LAB — HEMOGLOBIN: Hemoglobin: 10.6 g/dL — ABNORMAL LOW (ref 13.0–17.0)

## 2020-02-20 MED ORDER — PROMETHAZINE HCL 25 MG/ML IJ SOLN
12.5000 mg | Freq: Once | INTRAMUSCULAR | Status: AC
  Administered 2020-02-20: 12.5 mg via INTRAVENOUS
  Filled 2020-02-20: qty 1

## 2020-02-20 MED ORDER — MORPHINE SULFATE (PF) 2 MG/ML IV SOLN
1.0000 mg | Freq: Once | INTRAVENOUS | Status: AC
  Administered 2020-02-20: 1 mg via INTRAVENOUS
  Filled 2020-02-20: qty 1

## 2020-02-20 MED ORDER — OXYCODONE-ACETAMINOPHEN 7.5-325 MG PO TABS
1.0000 | ORAL_TABLET | ORAL | Status: DC | PRN
  Administered 2020-02-20 – 2020-02-21 (×5): 1 via ORAL
  Filled 2020-02-20 (×5): qty 1

## 2020-02-20 MED ORDER — LABETALOL HCL 5 MG/ML IV SOLN
10.0000 mg | INTRAVENOUS | Status: DC | PRN
  Administered 2020-02-20 (×2): 10 mg via INTRAVENOUS
  Filled 2020-02-20 (×2): qty 4

## 2020-02-20 NOTE — Progress Notes (Signed)
   02/20/20 1102  Assess: MEWS Score  Temp 97.9 F (36.6 C)  BP (!) 203/104  Pulse Rate (!) 105  Resp 20  SpO2 99 %  O2 Device Room Air  Assess: MEWS Score  MEWS Temp 0  MEWS Systolic 2  MEWS Pulse 1  MEWS RR 0  MEWS LOC 0  MEWS Score 3  MEWS Score Color Yellow  Assess: if the MEWS score is Yellow or Red  Were vital signs taken at a resting state? Yes  Focused Assessment No change from prior assessment  Early Detection of Sepsis Score *See Row Information* Low  MEWS guidelines implemented *See Row Information* Yes  Treat  MEWS Interventions Administered scheduled meds/treatments;Administered prn meds/treatments  Patients response to intervention Effective  Take Vital Signs  Increase Vital Sign Frequency  Yellow: Q 2hr X 2 then Q 4hr X 2, if remains yellow, continue Q 4hrs  Escalate  MEWS: Escalate Yellow: discuss with charge nurse/RN and consider discussing with provider and RRT  Notify: Charge Nurse/RN  Name of Charge Nurse/RN Notified Barnabas Lister, RN  Date Charge Nurse/RN Notified 02/20/20  Time Charge Nurse/RN Notified 1130  Notify: Provider  Provider Name/Title Loletha Grayer  Date Provider Notified 02/20/20  Time Provider Notified 1102  Notification Type Face-to-face  Notification Reason Change in status  Response See new orders  Date of Provider Response 02/20/20  Time of Provider Response 1102  Document  Patient Outcome Stabilized after interventions  Progress note created (see row info) Yes

## 2020-02-20 NOTE — TOC Initial Note (Signed)
Transition of Care [facility]) - Initial/Assessment Note    Patient Details  Name: Jerry Galloway MRN: [id number] Date of Birth: May 27, 1950  Transition of Care [facility]) CM/SW Contact:    Candie Chroman, LCSW Phone Number: 02/20/2020, 12:44 PM  Clinical Narrative: Patient not fully oriented. CSW called patient's daughter since wife is admitted as well. CSW introduced role. Daughter confirmed patient is getting home hospice services through [facility]. Their representative is aware. Daughter stated that hospice was originally coming out twice per week but has decreased to once per week since patient is doing well. Discussed concerns at admission. Daughter stated it is believed their granddaughter which the patient and his wife raised gave them the medication (reported from medical staff possible Xanax laced with Fentanyl). CSW asked daughter if she could think of a reason why she would do that and she responded "I guess she's trying to kill them!" Daughter confirmed she made a police report. CSW made her aware of plan for APS report. Daughter planning to see her mother today to see if she can provide her with further information. CSW called APS and made report to [place]. No further concerns. CSW encouraged patient to contact CSW as needed. CSW will continue to follow patient and his wife for support and facilitate return home when stable.        Expected Discharge Plan: Home w Hospice Care Barriers to Discharge: Continued Medical Work up   Patient Goals and CMS Choice        Expected Discharge Plan and Services Expected Discharge Plan: [place] Acute Care Choice: Resumption of Svcs/PTA Provider Living arrangements for the past 2 months: Single Family Home                                      Prior Living Arrangements/Services Living arrangements for the past 2 months: Single Family Home Lives with:: Spouse Patient language and need for  interpreter reviewed:: Yes        Need for Family Participation in Patient Care: Yes (Comment) Care giver support system in place?: Yes (comment) Current home services: DME, Hospice Criminal Activity/Legal Involvement Pertinent to Current Situation/Hospitalization: Yes - Comment as needed (Granddaughter may have given them Xanax laced with Fentanyl per report.)  Activities of Daily Living Home Assistive Devices/Equipment: Cane (specify quad or straight) ADL Screening (condition at time of admission) Patient's cognitive ability adequate to safely complete daily activities?: Yes Is the patient deaf or have difficulty hearing?: Yes Does the patient have difficulty seeing, even when wearing glasses/contacts?: No Does the patient have difficulty concentrating, remembering, or making decisions?: Yes Patient able to express need for assistance with ADLs?: Yes Does the patient have difficulty dressing or bathing?: No Independently performs ADLs?: Yes (appropriate for developmental age) Does the patient have difficulty walking or climbing stairs?: No Weakness of Legs: None Weakness of Arms/Hands: None  Permission Sought/Granted Permission sought to share information with : Facility Sport and exercise psychologist, Family Supports    Share Information with NAME: Annitta Jersey  Permission granted to share info w AGENCY: [facility], APS  Permission granted to share info w Relationship: Daughter  Permission granted to share info w Contact Information: [phone]  Emotional Assessment Appearance:: Appears stated age Attitude/Demeanor/Rapport: Unable to Assess Affect (typically observed): Unable to Assess Orientation: : Oriented to Self, Oriented to Place Alcohol / Substance  Use: Not Applicable Psych Involvement: Yes (comment)  Admission diagnosis:  Polysubstance abuse ([place]) [F19.10] Altered mental status, unspecified altered mental status type [J64.38] Acute metabolic encephalopathy  [P77.93] Patient Active Problem List   Diagnosis Date Noted  . Acute blood loss anemia   . Acute metabolic encephalopathy 96/88/6484  . Accidental drug overdose 02/18/2020  . CKD (chronic kidney disease), stage IV ([place]) 02/18/2020  . Symptomatic anemia 02/13/2020  . ESRD (end stage renal disease) ([place])   . Other hyperlipidemia   . 'light-for-dates' infant with signs of fetal malnutrition 02/12/2020  . Rheumatoid arthritis ([place]) 12/07/2019  . Essential hypertension 12/15/2018  . Chronic pain syndrome 12/15/2018   PCP:  Sherald Hess., MD Pharmacy:   [id number] [address], [place] [address] Phone: [phone] Fax: [phone]     Social Determinants of Health (SDOH) Interventions    Readmission Risk Interventions No flowsheet data found.

## 2020-02-20 NOTE — Progress Notes (Signed)
Patient ID: Jerry Galloway, male   DOB: 07-14-1950, 69 y.o.   MRN: [id number] [facility] PROGRESS NOTE  KEMONTE ULLMAN QMG:[id number] DOB: 11/21/1950 DOA: 02/18/2020 PCP: Sherald Hess., MD  HPI/Subjective: Patient having nausea and vomiting this morning and did not eat. He received Zofran which did not help. I had to give a dose of Phenergan and IV morphine. When I saw the patient he was not complaining of any pain answered some yes or no questions and was able to follow commands. He did tell the nurse that he did have back pain.  Objective: Vitals:   02/20/20 1238 02/20/20 1256  BP: (!) 191/98 (!) 154/80  Pulse: 83 90  Resp:  20  Temp:  (!) 97.5 F (36.4 C)  SpO2:  98%    Intake/Output Summary (Last 24 hours) at 02/20/2020 1328 Last data filed at 02/20/2020 1102 Gross per 24 hour  Intake --  Output 2000 ml  Net -2000 ml   Filed Weights   02/18/20 2002  Weight: 72 kg    ROS: Review of Systems  Respiratory: Negative for shortness of breath.   Cardiovascular: Negative for chest pain.  Gastrointestinal: Positive for nausea and vomiting. Negative for abdominal pain.   Exam: Physical Exam HENT:     Head: Normocephalic.     Mouth/Throat:     Pharynx: No oropharyngeal exudate.  Eyes:     General: Lids are normal.     Conjunctiva/sclera: Conjunctivae normal.     Pupils: Pupils are equal, round, and reactive to light.  Cardiovascular:     Rate and Rhythm: Normal rate and regular rhythm.     Heart sounds: Normal heart sounds, S1 normal and S2 normal.  Pulmonary:     Breath sounds: Normal breath sounds. No decreased breath sounds, wheezing, rhonchi or rales.  Abdominal:     General: Abdomen is flat.     Palpations: Abdomen is soft.     Tenderness: There is no abdominal tenderness.  Musculoskeletal:     Right lower leg: No swelling.     Left lower leg: No swelling.  Skin:    General: Skin is warm.     Findings: No rash.  Neurological:      Mental Status: He is alert.     Comments: Answers some simple yes/no questions but unable to elaborate. Able to follow some simple commands and raise up his arms and do straight leg raise for me.       Data Reviewed: Basic Metabolic Panel: Recent Labs  Lab 02/14/20 0733 02/18/20 2015 02/20/20 1052  NA 141 145 142  K 4.2 3.9 3.9  CL 114* 112* 108  CO2 16* 19* 19*  GLUCOSE 98 104* 157*  BUN 21 24* 26*  CREATININE 2.54* 2.79* 2.36*  CALCIUM 8.6* 9.4 9.2   Liver Function Tests: Recent Labs  Lab 02/18/20 2015  AST 16  ALT 8  ALKPHOS 78  BILITOT 1.0  PROT 7.9  ALBUMIN 3.9    Recent Labs  Lab 02/18/20 2037  AMMONIA 27   CBC: Recent Labs  Lab 02/13/20 1549 02/14/20 0733 02/18/20 2015 02/20/20 1052  WBC  --  7.1 12.3*  --   NEUTROABS  --  4.6 9.5*  --   HGB 8.4* 8.5* 10.3* 10.6*  HCT 25.6* 26.3* 32.2*  --   MCV  --  96.7 97.3  --   PLT  --  279 335  --    Cardiac Enzymes: Recent Labs  Lab 02/18/20 2015  CKTOTAL 54    CBG: Recent Labs  Lab 02/18/20 2020 02/18/20 2144 02/19/20 0314 02/19/20 0440 02/19/20 0739  GLUCAP 63* 182* 108* 106* 96    Recent Results (from the past 240 hour(s))  Respiratory Panel by RT PCR (Flu A&B, Covid) - Nasopharyngeal Swab     Status: None   Collection Time: 02/12/20  9:09 PM   Specimen: Nasopharyngeal Swab  Result Value Ref Range Status   SARS Coronavirus 2 by RT PCR NEGATIVE NEGATIVE Final    Comment: (NOTE) SARS-CoV-2 target nucleic acids are NOT DETECTED.  The SARS-CoV-2 RNA is generally detectable in upper respiratoy specimens during the acute phase of infection. The lowest concentration of SARS-CoV-2 viral copies this assay can detect is 131 copies/mL. A negative result does not preclude SARS-Cov-2 infection and should not be used as the sole basis for treatment or other patient management decisions. A negative result may occur with  improper specimen collection/handling, submission of specimen other than  nasopharyngeal swab, presence of viral mutation(s) within the areas targeted by this assay, and inadequate number of viral copies (<131 copies/mL). A negative result must be combined with clinical observations, patient history, and epidemiological information. The expected result is Negative.  Fact Sheet for Patients:  PinkCheek.be  Fact Sheet for Healthcare Providers:  GravelBags.it  This test is no t yet approved or cleared by the [place] FDA and  has been authorized for detection and/or diagnosis of SARS-CoV-2 by FDA under an Emergency Use Authorization (EUA). This EUA will remain  in effect (meaning this test can be used) for the duration of the COVID-19 declaration under Section 564(b)(1) of the Act, 21 U.S.C. section 360bbb-3(b)(1), unless the authorization is terminated or revoked sooner.     Influenza A by PCR NEGATIVE NEGATIVE Final   Influenza B by PCR NEGATIVE NEGATIVE Final    Comment: (NOTE) The Xpert Xpress SARS-CoV-2/FLU/RSV assay is intended as an aid in  the diagnosis of influenza from Nasopharyngeal swab specimens and  should not be used as a sole basis for treatment. Nasal washings and  aspirates are unacceptable for Xpert Xpress SARS-CoV-2/FLU/RSV  testing.  Fact Sheet for Patients: PinkCheek.be  Fact Sheet for Healthcare Providers: GravelBags.it  This test is not yet approved or cleared by the [place] FDA and  has been authorized for detection and/or diagnosis of SARS-CoV-2 by  FDA under an Emergency Use Authorization (EUA). This EUA will remain  in effect (meaning this test can be used) for the duration of the  Covid-19 declaration under Section 564(b)(1) of the Act, 21  U.S.C. section 360bbb-3(b)(1), unless the authorization is  terminated or revoked. Performed at [facility], [address] [place], [place]  [address]   Respiratory Panel by RT PCR (Flu A&B, Covid) - Nasopharyngeal Swab     Status: None   Collection Time: 02/18/20 12:56 AM   Specimen: Nasopharyngeal Swab  Result Value Ref Range Status   SARS Coronavirus 2 by RT PCR NEGATIVE NEGATIVE Final    Comment: (NOTE) SARS-CoV-2 target nucleic acids are NOT DETECTED.  The SARS-CoV-2 RNA is generally detectable in upper respiratoy specimens during the acute phase of infection. The lowest concentration of SARS-CoV-2 viral copies this assay can detect is 131 copies/mL. A negative result does not preclude SARS-Cov-2 infection and should not be used as the sole basis for treatment or other patient management decisions. A negative result may occur with  improper specimen collection/handling, submission of specimen other than nasopharyngeal  swab, presence of viral mutation(s) within the areas targeted by this assay, and inadequate number of viral copies (<131 copies/mL). A negative result must be combined with clinical observations, patient history, and epidemiological information. The expected result is Negative.  Fact Sheet for Patients:  PinkCheek.be  Fact Sheet for Healthcare Providers:  GravelBags.it  This test is no t yet approved or cleared by the [place] FDA and  has been authorized for detection and/or diagnosis of SARS-CoV-2 by FDA under an Emergency Use Authorization (EUA). This EUA will remain  in effect (meaning this test can be used) for the duration of the COVID-19 declaration under Section 564(b)(1) of the Act, 21 U.S.C. section 360bbb-3(b)(1), unless the authorization is terminated or revoked sooner.     Influenza A by PCR NEGATIVE NEGATIVE Final   Influenza B by PCR NEGATIVE NEGATIVE Final    Comment: (NOTE) The Xpert Xpress SARS-CoV-2/FLU/RSV assay is intended as an aid in  the diagnosis of influenza from Nasopharyngeal swab specimens and  should not be  used as a sole basis for treatment. Nasal washings and  aspirates are unacceptable for Xpert Xpress SARS-CoV-2/FLU/RSV  testing.  Fact Sheet for Patients: PinkCheek.be  Fact Sheet for Healthcare Providers: GravelBags.it  This test is not yet approved or cleared by the [place] FDA and  has been authorized for detection and/or diagnosis of SARS-CoV-2 by  FDA under an Emergency Use Authorization (EUA). This EUA will remain  in effect (meaning this test can be used) for the duration of the  Covid-19 declaration under Section 564(b)(1) of the Act, 21  U.S.C. section 360bbb-3(b)(1), unless the authorization is  terminated or revoked. Performed at [facility], [address] [place], [place] [address]   Blood Cultures (routine x 2)     Status: None (Preliminary result)   Collection Time: 02/18/20  8:15 PM   Specimen: BLOOD LEFT FOREARM  Result Value Ref Range Status   Specimen Description BLOOD LEFT FOREARM  Final   Special Requests   Final    BOTTLES DRAWN AEROBIC AND ANAEROBIC Blood Culture results may not be optimal due to an excessive volume of blood received in culture bottles   Culture   Final    NO GROWTH 2 DAYS Performed at [facility], [address], [place], [place] [address]    Report Status PENDING  Incomplete     Studies: CT HEAD WO CONTRAST  Result Date: 02/18/2020 CLINICAL DATA:  69 year old male with delirium. EXAM: CT HEAD WITHOUT CONTRAST TECHNIQUE: Contiguous axial images were obtained from the base of the skull through the vertex without intravenous contrast. COMPARISON:  Head CT dated 12/12/2008. FINDINGS: Brain: The ventricles and sulci appropriate size for patient's age. The gray-white matter discrimination is preserved. There is no acute intracranial hemorrhage. No mass effect or midline shift. No extra-axial fluid collection. Vascular: No hyperdense vessel or unexpected  calcification. Skull: Normal. Negative for fracture or focal lesion. Sinuses/Orbits: Mild mucoperiosteal thickening of paranasal sinuses. No air-fluid level. Bilateral mastoid effusions. Other: None IMPRESSION: 1. No acute intracranial pathology. 2. Bilateral mastoid effusions. Electronically Signed   By: Anner Crete M.D.   On: 02/18/2020 21:36   DG Chest Port 1 View  Result Date: 02/18/2020 CLINICAL DATA:  Altered level of consciousness EXAM: PORTABLE CHEST 1 VIEW COMPARISON:  08/31/2007 FINDINGS: The heart size and mediastinal contours are within normal limits. Both lungs are clear. The visualized skeletal structures are unremarkable. IMPRESSION: No active disease. Electronically Signed   By: Legrand Como  Owens Shark M.D.   On: 02/18/2020 21:20    Scheduled Meds: . amLODipine  5 mg Oral Daily  . calcitRIOL  0.25 mcg Oral Daily  . enoxaparin (LOVENOX) injection  30 mg Subcutaneous Daily  . influenza vaccine adjuvanted  0.5 mL Intramuscular Tomorrow-1000  . nicotine  21 mg Transdermal Daily  . pneumococcal 23 valent vaccine  0.5 mL Intramuscular Tomorrow-1000  . sodium bicarbonate  1,300 mg Oral BID   Continuous Infusions: . sodium chloride 50 mL/hr at 02/20/20 1053    Assessment/Plan:  1. Acute metabolic encephalopathy.  Mental status better today but not quite back to normal.  In speaking with patient's daughter the patient may have had Xanax laced with fentanyl given to him by grandniece. 2. Drug overdose.  Likely Xanax laced with fentanyl.  Continue to monitor.  Appreciate psychiatric consultation.  I do not think this was intentional on the patient's part.  Case discussed with transitional care team who put out an APS referral. 3. Nausea vomiting probably secondary to opioid withdrawal.  I did give a dose of Phenergan and IV morphine earlier.  We will restart the patient's Endocet.  Continue to hold long-acting pain medications Xtampza ER and Butrans patch. 4. Essential hypertension.  Blood  pressure elevated this morning secondary to nausea vomiting.  After amlodipine blood pressure better.  As needed IV labetalol ordered. 5. Hyperlipidemia unspecified.  Continue to hold Crestor at this point. 6. Chronic kidney disease stage IV.  Creatinine improved with gentle IV fluids down to 2.36 with a GFR of 29. 7. Recent admission for acute blood loss anemia.  Today's hemoglobin 10.6 which is higher than it was before. 8. Severe chronic pain syndrome.  Came in with drug overdose.  Patient takes to long-acting pain medications and short acting pain medication and was given medication that likely a benzodiazepine and fentanyl.  I will only give short acting pain medication right now. 9. Weakness.  Physical therapy evaluation     Code Status:     Code Status Orders  (From admission, onward)         Start     Ordered   02/18/20 2302  Full code  Continuous        02/18/20 2302        Code Status History    Date Active Date Inactive Code Status Order ID Comments User Context   02/12/2020 2152 02/14/2020 2013 Full Code [id number]  Mansy, Arvella Merles, MD ED   Advance Care Planning Activity     Family Communication: Spoke with the patient's daughter on the phone Disposition Plan: Status is: Inpatient  Dispo: The patient is from: Home              Anticipated d/c is to: Home with hospice, APS referral              Anticipated d/c date is: Is dependent on how quickly he returns back to his baseline mental status.  Potentially 02/21/2020 versus 02/22/2020.              Patient currently had nausea vomiting this morning likely from opioid withdrawal.  Came in with drug overdose.  Still holding long-acting pain medications and will only do short acting pain medications at this point.  Consultants:  Psychiatry  Time spent: 28 minutes  [place]

## 2020-02-20 NOTE — Progress Notes (Signed)
PT Cancellation Note  Patient Details Name: Jerry Galloway MRN: [id number] DOB: Feb 01, 1951   Cancelled Treatment:    Reason Eval/Treat Not Completed: Other (comment). Per chart pt with elevated BP, per PT guidelines exertional activity held until pt is more appropriate. PT to re-attempt as able.   Lieutenant Diego PT, DPT 11:26 AM,02/20/20

## 2020-02-20 NOTE — Hospital Course (Signed)
Patient admitted 02/18/2020 with acute metabolic encephalopathy.  Patient's daughter thinks he got medications from his grandniece of Xanax laced with fentanyl.  The patient takes 2 long-acting pain medications including Xtampza ER and Butrans patch and Endocet short acting pain medication chronically.  Mental status better on 02/20/2020 but still slow with answers and does not elaborate.  Did have nausea vomiting which could be secondary to opioid withdrawal.  I did give 1 dose of morphine and restarted Endocet.  Still holding long-acting pain medications at this point.

## 2020-02-20 NOTE — Consult Note (Signed)
[facility] Consult   Reason for Consult:   Follow-up consult for 69 year old man brought in with acute encephalopathy Referring Physician:   Leslye Peer Patient Identification: Jerry Galloway MRN:  [id number] Principal Diagnosis: Acute metabolic encephalopathy Diagnosis:  Principal Problem:   Acute metabolic encephalopathy Active Problems:   Hyperlipidemia   Essential hypertension   Chronic pain syndrome   Accidental drug overdose   CKD (chronic kidney disease), stage IV (HCC)   Nausea and vomiting   Total Time spent with patient: 1 hour  Subjective:   Jerry Galloway is a 69 y.o. male patient admitted with "I am all right".  HPI:   Patient seen chart reviewed.  This is a follow-up on the initial assessment from yesterday.  Patient was awake and sitting up in his room and was cooperative with conversation.  His only complaint was of his chronic back pain.  Patient knew that he was in the hospital and knew the correct year but was not able to clearly explain why he was in the hospital.  He had no memory of being passed out or having EMS come to his house.  Patient denies having taken any medication other than what he is normally prescribed.  He states that his mood recently has been fine.  He denies any suicidal thought.  Affect appears to be constricted.  Short-term memory somewhat impaired.  Did not appear to be responding to internal stimuli no sign of psychosis.  Past Psychiatric History: No known past psychiatric history.  No history of depression no history of suicide attempts no history of psychiatric hospitalization  Risk to Self:   Risk to Others:   Prior Inpatient Therapy:   Prior Outpatient Therapy:    Past Medical History:  Past Medical History:  Diagnosis Date  . Chronic pain   . CKD (chronic kidney disease)   . HTN (hypertension)    History reviewed. No pertinent surgical history. Family History: History reviewed. No pertinent family  history. Family Psychiatric  History: Reportedly substance abuse and some family members Social History:  Social History   Substance and Sexual Activity  Alcohol Use Never     Social History   Substance and Sexual Activity  Drug Use Never    Social History   Socioeconomic History  . Marital status: Married    Spouse name: Not on file  . Number of children: Not on file  . Years of education: Not on file  . Highest education level: Not on file  Occupational History  . Not on file  Tobacco Use  . Smoking status: Current Every Day Smoker  . Smokeless tobacco: Never Used  Substance and Sexual Activity  . Alcohol use: Never  . Drug use: Never  . Sexual activity: Not on file  Other Topics Concern  . Not on file  Social History Narrative  . Not on file   Social Determinants of Health   Financial Resource Strain:   . Difficulty of Paying Living Expenses: Not on file  Food Insecurity:   . Worried About Charity fundraiser in the Last Year: Not on file  . Ran Out of Food in the Last Year: Not on file  Transportation Needs:   . Lack of Transportation (Medical): Not on file  . Lack of Transportation (Non-Medical): Not on file  Physical Activity:   . Days of Exercise per Week: Not on file  . Minutes of Exercise per Session: Not on file  Stress:   . Feeling  of Stress : Not on file  Social Connections:   . Frequency of Communication with Friends and Family: Not on file  . Frequency of Social Gatherings with Friends and Family: Not on file  . Attends Religious Services: Not on file  . Active Member of Clubs or Organizations: Not on file  . Attends Archivist Meetings: Not on file  . Marital Status: Not on file   Additional Social History:    Allergies:  No Known Allergies  Labs:  Results for orders placed or performed during the hospital encounter of 02/18/20 (from the past 48 hour(s))  Comprehensive metabolic panel     Status: Abnormal   Collection Time:  02/18/20  8:15 PM  Result Value Ref Range   Sodium 145 135 - 145 mmol/L   Potassium 3.9 3.5 - 5.1 mmol/L   Chloride 112 (H) 98 - 111 mmol/L   CO2 19 (L) 22 - 32 mmol/L   Glucose, Bld 104 (H) 70 - 99 mg/dL    Comment: Glucose reference range applies only to samples taken after fasting for at least 8 hours.   BUN 24 (H) 8 - 23 mg/dL   Creatinine, Ser 2.79 (H) 0.61 - 1.24 mg/dL   Calcium 9.4 8.9 - 10.3 mg/dL   Total Protein 7.9 6.5 - 8.1 g/dL   Albumin 3.9 3.5 - 5.0 g/dL   AST 16 15 - 41 U/L   ALT 8 0 - 44 U/L   Alkaline Phosphatase 78 38 - 126 U/L   Total Bilirubin 1.0 0.3 - 1.2 mg/dL   GFR, Estimated 23 (L) >60 mL/min    Comment: (NOTE) Calculated using the CKD-EPI Creatinine Equation (2021)    Anion gap 14 5 - 15    Comment: Performed at [facility], [address] [place], [place] [address]  CBC WITH DIFFERENTIAL     Status: Abnormal   Collection Time: 02/18/20  8:15 PM  Result Value Ref Range   WBC 12.3 (H) 4.0 - 10.5 K/uL   RBC 3.31 (L) 4.22 - 5.81 MIL/uL   Hemoglobin 10.3 (L) 13.0 - 17.0 g/dL   HCT 32.2 (L) 39 - 52 %   MCV 97.3 80.0 - 100.0 fL   MCH 31.1 26.0 - 34.0 pg   MCHC 32.0 30.0 - 36.0 g/dL   RDW 14.2 11.5 - 15.5 %   Platelets 335 150 - 400 K/uL   nRBC 0.0 0.0 - 0.2 %   Neutrophils Relative % 76 %   Neutro Abs 9.5 (H) 1.7 - 7.7 K/uL   Lymphocytes Relative 16 %   Lymphs Abs 1.9 0.7 - 4.0 K/uL   Monocytes Relative 3 %   Monocytes Absolute 0.3 0.1 - 1.0 K/uL   Eosinophils Relative 4 %   Eosinophils Absolute 0.5 0.0 - 0.5 K/uL   Basophils Relative 1 %   Basophils Absolute 0.1 0.0 - 0.1 K/uL   Immature Granulocytes 0 %   Abs Immature Granulocytes 0.03 0.00 - 0.07 K/uL    Comment: Performed at [facility], [address] [place], [place] [address]  Lactic acid, plasma     Status: None   Collection Time: 02/18/20  8:15 PM  Result Value Ref Range   Lactic Acid, Venous 1.7 0.5 - 1.9 mmol/L    Comment: Performed at [facility],  [address] [place], [place] [address]  Ethanol     Status: None   Collection Time: 02/18/20  8:15 PM  Result Value Ref  Range   Alcohol, Ethyl (B) <10 <10 mg/dL    Comment: (NOTE) Lowest detectable limit for serum alcohol is 10 mg/dL.  For medical purposes only. Performed at [facility], [address] [place], [place] [address]   Blood Cultures (routine x 2)     Status: None (Preliminary result)   Collection Time: 02/18/20  8:15 PM   Specimen: BLOOD LEFT FOREARM  Result Value Ref Range   Specimen Description BLOOD LEFT FOREARM    Special Requests      BOTTLES DRAWN AEROBIC AND ANAEROBIC Blood Culture results may not be optimal due to an excessive volume of blood received in culture bottles   Culture      NO GROWTH 2 DAYS Performed at [facility], [address] [place], [place] [address]    Report Status PENDING   Protime-INR     Status: None   Collection Time: 02/18/20  8:15 PM  Result Value Ref Range   Prothrombin Time 13.0 11.4 - 15.2 seconds   INR 1.0 0.8 - 1.2    Comment: (NOTE) INR goal varies based on device and disease states. Performed at [facility], [address] [place], [place] [address]   CK     Status: None   Collection Time: 02/18/20  8:15 PM  Result Value Ref Range   Total CK 54 49.0 - 397.0 U/L    Comment: Performed at [facility], [address] [place], [place] [address]  Salicylate level     Status: Abnormal   Collection Time: 02/18/20  8:15 PM  Result Value Ref Range   Salicylate Lvl <3.8 (L) 7.0 - 30.0 mg/dL    Comment: Performed at [facility], [address] [place], [place] [address]  Acetaminophen level     Status: Abnormal   Collection Time: 02/18/20  8:15 PM  Result Value Ref Range   Acetaminophen (Tylenol), Serum <10 (L) 10 - 30 ug/mL    Comment: (NOTE) Therapeutic concentrations vary significantly. A range of 10-30 ug/mL  may be an effective concentration for many  patients. However, some  are best treated at concentrations outside of this range. Acetaminophen concentrations >150 ug/mL at 4 hours after ingestion  and >50 ug/mL at 12 hours after ingestion are often associated with  toxic reactions.  Performed at [facility], [place], [place] [address]   Troponin I (High Sensitivity)     Status: Abnormal   Collection Time: 02/18/20  8:15 PM  Result Value Ref Range   Troponin I (High Sensitivity) 19 (H) <18 ng/L    Comment: (NOTE) Elevated high sensitivity troponin I (hsTnI) values and significant  changes across serial measurements may suggest ACS but many other  chronic and acute conditions are known to elevate hsTnI results.  Refer to the "Links" section for chest pain algorithms and additional  guidance. Performed at [facility], [address] [place], [place] [address]   POC CBG, ED     Status: Abnormal   Collection Time: 02/18/20  8:20 PM  Result Value Ref Range   Glucose-Capillary 63 (L) 70 - 99 mg/dL    Comment: Glucose reference range applies only to samples taken after fasting for at least 8 hours.  Cooxemetry Panel, hospital-performed (carboxy, met, total hgb, O2 sat)     Status: Abnormal (Preliminary result)   Collection Time: 02/18/20  8:25 PM  Result Value Ref Range   O2 Saturation PENDING %   Carboxyhemoglobin 3.0 (  H) 0.5 - 1.5 %   Methemoglobin 0.2 0.0 - 1.5 %    Comment: Performed at [facility], [address] [place], [place] [address]   Total oxygen content PENDING 15.0 - 23.0 mL/dL  Ammonia     Status: None   Collection Time: 02/18/20  8:37 PM  Result Value Ref Range   Ammonia 27 9 - 35 umol/L    Comment: Performed at [facility], [address] [place], [place] [address]  Urinalysis, Complete w Microscopic     Status: Abnormal   Collection Time: 02/18/20  9:03 PM  Result Value Ref Range   Color, Urine YELLOW (A) YELLOW   APPearance CLEAR (A) CLEAR    Specific Gravity, Urine 1.008 1.005 - 1.030   pH 5.0 5.0 - 8.0   Glucose, UA NEGATIVE NEGATIVE mg/dL   Hgb urine dipstick SMALL (A) NEGATIVE   Bilirubin Urine NEGATIVE NEGATIVE   Ketones, ur NEGATIVE NEGATIVE mg/dL   Protein, ur NEGATIVE NEGATIVE mg/dL   Nitrite NEGATIVE NEGATIVE   Leukocytes,Ua NEGATIVE NEGATIVE   RBC / HPF 0-5 0 - 5 RBC/hpf   WBC, UA 0-5 0 - 5 WBC/hpf   Bacteria, UA NONE SEEN NONE SEEN   Squamous Epithelial / LPF NONE SEEN 0 - 5    Comment: Performed at [facility], [address] [place], [place] [address]  Urine Drug Screen, Qualitative     Status: Abnormal   Collection Time: 02/18/20  9:03 PM  Result Value Ref Range   Tricyclic, Ur Screen NONE DETECTED NONE DETECTED   Amphetamines, Ur Screen NONE DETECTED NONE DETECTED   MDMA (Ecstasy)Ur Screen NONE DETECTED NONE DETECTED   Cocaine Metabolite,Ur Andrews NONE DETECTED NONE DETECTED   Opiate, Ur Screen POSITIVE (A) NONE DETECTED   Phencyclidine (PCP) Ur S NONE DETECTED NONE DETECTED   Cannabinoid 50 Ng, Ur Naylor NONE DETECTED NONE DETECTED   Barbiturates, Ur Screen NONE DETECTED NONE DETECTED   Benzodiazepine, Ur Scrn POSITIVE (A) NONE DETECTED   Methadone Scn, Ur NONE DETECTED NONE DETECTED    Comment: (NOTE) Tricyclics + metabolites, urine    Cutoff 1000 ng/mL Amphetamines + metabolites, urine  Cutoff 1000 ng/mL MDMA (Ecstasy), urine              Cutoff 500 ng/mL Cocaine Metabolite, urine          Cutoff 300 ng/mL Opiate + metabolites, urine        Cutoff 300 ng/mL Phencyclidine (PCP), urine         Cutoff 25 ng/mL Cannabinoid, urine                 Cutoff 50 ng/mL Barbiturates + metabolites, urine  Cutoff 200 ng/mL Benzodiazepine, urine              Cutoff 200 ng/mL Methadone, urine                   Cutoff 300 ng/mL  The urine drug screen provides only a preliminary, unconfirmed analytical test result and should not be used for non-medical purposes. Clinical consideration and professional judgment  should be applied to any positive drug screen result due to possible interfering substances. A more specific alternate chemical method must be used in order to obtain a confirmed analytical result. Gas chromatography / mass spectrometry (GC/MS) is the preferred confirm atory method. Performed at [facility], [address], [place],  [address]   CBG monitoring, ED  Status: Abnormal   Collection Time: 02/18/20  9:44 PM  Result Value Ref Range   Glucose-Capillary 182 (H) 70 - 99 mg/dL    Comment: Glucose reference range applies only to samples taken after fasting for at least 8 hours.  CBG monitoring, ED     Status: Abnormal   Collection Time: 02/19/20  3:14 AM  Result Value Ref Range   Glucose-Capillary 108 (H) 70 - 99 mg/dL    Comment: Glucose reference range applies only to samples taken after fasting for at least 8 hours.   Comment 1 QC Due   CBG monitoring, ED     Status: Abnormal   Collection Time: 02/19/20  4:40 AM  Result Value Ref Range   Glucose-Capillary 106 (H) 70 - 99 mg/dL    Comment: Glucose reference range applies only to samples taken after fasting for at least 8 hours.  CBG monitoring, ED     Status: None   Collection Time: 02/19/20  7:39 AM  Result Value Ref Range   Glucose-Capillary 96 70 - 99 mg/dL    Comment: Glucose reference range applies only to samples taken after fasting for at least 8 hours.  Basic metabolic panel     Status: Abnormal   Collection Time: 02/20/20 10:52 AM  Result Value Ref Range   Sodium 142 135 - 145 mmol/L   Potassium 3.9 3.5 - 5.1 mmol/L   Chloride 108 98 - 111 mmol/L   CO2 19 (L) 22 - 32 mmol/L   Glucose, Bld 157 (H) 70 - 99 mg/dL    Comment: Glucose reference range applies only to samples taken after fasting for at least 8 hours.   BUN 26 (H) 8 - 23 mg/dL   Creatinine, Ser 2.36 (H) 0.61 - 1.24 mg/dL   Calcium 9.2 8.9 - 10.3 mg/dL   GFR, Estimated 29 (L) >60 mL/min    Comment: (NOTE) Calculated using the  CKD-EPI Creatinine Equation (2021)    Anion gap 15 5 - 15    Comment: Performed at [facility], [address] [place], [place] [address]  Hemoglobin     Status: Abnormal   Collection Time: 02/20/20 10:52 AM  Result Value Ref Range   Hemoglobin 10.6 (L) 13.0 - 17.0 g/dL    Comment: Performed at [facility], [address], [place], [place] [address]    Current Facility-Administered Medications  Medication Dose Route Frequency Provider Last Rate Last Admin  . 0.9 %  sodium chloride infusion   Intravenous Continuous Loletha Grayer, MD 50 mL/hr at 02/20/20 1053 New Bag at 02/20/20 1053  . amLODipine (NORVASC) tablet 5 mg  5 mg Oral Daily Loletha Grayer, MD   5 mg at 02/20/20 1047  . calcitRIOL (ROCALTROL) capsule 0.25 mcg  0.25 mcg Oral Daily Loletha Grayer, MD   0.25 mcg at 02/20/20 1047  . enoxaparin (LOVENOX) injection 30 mg  30 mg Subcutaneous Daily Judd Gaudier V, MD   30 mg at 02/20/20 1048  . influenza vaccine adjuvanted (FLUAD) injection 0.5 mL  0.5 mL Intramuscular Tomorrow-1000 Wieting, Richard, MD      . labetalol (NORMODYNE) injection 10 mg  10 mg Intravenous Q2H PRN Loletha Grayer, MD   10 mg at 02/20/20 1204  . naloxone [facility]) injection 0.4 mg  0.4 mg Intravenous PRN Athena Masse, MD      . nicotine (NICODERM CQ - dosed in mg/24 hours) patch 21 mg  21 mg Transdermal Daily Loletha Grayer, MD   21  mg at 02/20/20 1047  . nitroGLYCERIN (NITROSTAT) SL tablet 0.4 mg  0.4 mg Sublingual Q5 min PRN Loletha Grayer, MD      . ondansetron [facility]) tablet 4 mg  4 mg Oral Q6H PRN Athena Masse, MD       Or  . ondansetron [facility]) injection 4 mg  4 mg Intravenous Q6H PRN Athena Masse, MD   4 mg at 02/20/20 0545  . oxyCODONE-acetaminophen (PERCOCET) 7.5-325 MG per tablet 1 tablet  1 tablet Oral Q4H PRN Loletha Grayer, MD   1 tablet at 02/20/20 1104  . pneumococcal 23 valent vaccine (PNEUMOVAX-23) injection 0.5 mL  0.5 mL Intramuscular Tomorrow-1000  Wieting, Richard, MD      . sodium bicarbonate tablet 1,300 mg  1,300 mg Oral BID Loletha Grayer, MD   1,300 mg at 02/20/20 1047    Musculoskeletal: Strength & Muscle Tone: decreased Gait & Station: unsteady Patient leans: N/A  Psychiatric Specialty Exam: Physical Exam Vitals and nursing note reviewed.  Constitutional:      Appearance: He is well-developed.  HENT:     Head: Normocephalic and atraumatic.  Eyes:     Conjunctiva/sclera: Conjunctivae normal.     Pupils: Pupils are equal, round, and reactive to light.  Cardiovascular:     Heart sounds: Normal heart sounds.  Pulmonary:     Effort: Pulmonary effort is normal.  Abdominal:     Palpations: Abdomen is soft.  Musculoskeletal:        General: Normal range of motion.     Cervical back: Normal range of motion.  Skin:    General: Skin is warm and dry.  Neurological:     General: No focal deficit present.     Mental Status: He is alert.  Psychiatric:        Attention and Perception: Attention normal.        Mood and Affect: Affect is blunt.        Speech: Speech is delayed.        Behavior: Behavior is slowed.        Thought Content: Thought content is not paranoid. Thought content does not include homicidal or suicidal ideation.        Cognition and Memory: Memory is impaired.        Judgment: Judgment normal.     Review of Systems  Constitutional: Negative.   HENT: Negative.   Eyes: Negative.   Respiratory: Negative.   Cardiovascular: Negative.   Gastrointestinal: Negative.   Musculoskeletal: Positive for back pain.  Skin: Negative.   Neurological: Negative.   Psychiatric/Behavioral: Negative for dysphoric mood, self-injury, sleep disturbance and suicidal ideas. The patient is not nervous/anxious.     Blood pressure (!) 154/80, pulse 90, temperature (!) 97.5 F (36.4 C), temperature source Oral, resp. rate 20, height 6' (1.829 m), weight 72 kg, SpO2 98 %.Body mass index is 21.53 kg/m.  General Appearance:  Casual  Eye Contact:  Fair  Speech:  Slow  Volume:  Decreased  Mood:  Euthymic  Affect:  Constricted  Thought Process:  Coherent  Orientation:  Full (Time, Place, and Person)  Thought Content:  Logical  Suicidal Thoughts:  No  Homicidal Thoughts:  No  Memory:  Immediate;   Fair Recent;   Poor Remote;   Fair  Judgement:  Fair  Insight:  Shallow  Psychomotor Activity:  Decreased  Concentration:  Concentration: Poor  Recall:  Poor  Fund of Knowledge:  Poor  Language:  Fair  Akathisia:  No  Handed:  Right  AIMS (if indicated):     Assets:  Desire for Improvement Housing  ADL's:  Impaired  Cognition:  Impaired,  Mild  Sleep:        Treatment Plan Summary: Plan 69 year old man came into the hospital with altered mental status.  Circumstances had suggested the possibility of drug overdose.  Patient today is denying knowing of any drug overdose denying of any intentional taking of any extra medicine.  He denies symptoms of depression and denies suicidal thought.  Patient short-term memory shows some impairment.  He also reports not remembering how bad his kidney disease has been.  At this point no indication for IVC or initiation of any psychiatric medicine.  Wife has not yet become cooperative or lucid.  We will continue to follow-up with her.  We will follow-up with patient in the hospital as needed.  Disposition: Patient does not meet criteria for psychiatric inpatient admission. Supportive therapy provided about ongoing stressors.  Alethia Berthold, MD 02/20/2020 3:08 PM

## 2020-02-21 DIAGNOSIS — T50901A Poisoning by unspecified drugs, medicaments and biological substances, accidental (unintentional), initial encounter: Secondary | ICD-10-CM

## 2020-02-21 DIAGNOSIS — N184 Chronic kidney disease, stage 4 (severe): Secondary | ICD-10-CM

## 2020-02-21 LAB — CBC
HCT: 30.4 % — ABNORMAL LOW (ref 39.0–52.0)
Hemoglobin: 10.1 g/dL — ABNORMAL LOW (ref 13.0–17.0)
MCH: 32.1 pg (ref 26.0–34.0)
MCHC: 33.2 g/dL (ref 30.0–36.0)
MCV: 96.5 fL (ref 80.0–100.0)
Platelets: 288 10*3/uL (ref 150–400)
RBC: 3.15 MIL/uL — ABNORMAL LOW (ref 4.22–5.81)
RDW: 14 % (ref 11.5–15.5)
WBC: 11.2 10*3/uL — ABNORMAL HIGH (ref 4.0–10.5)
nRBC: 0 % (ref 0.0–0.2)

## 2020-02-21 LAB — BASIC METABOLIC PANEL
Anion gap: 13 (ref 5–15)
BUN: 36 mg/dL — ABNORMAL HIGH (ref 8–23)
CO2: 22 mmol/L (ref 22–32)
Calcium: 9.1 mg/dL (ref 8.9–10.3)
Chloride: 106 mmol/L (ref 98–111)
Creatinine, Ser: 2.59 mg/dL — ABNORMAL HIGH (ref 0.61–1.24)
GFR, Estimated: 26 mL/min — ABNORMAL LOW (ref >60)
Glucose, Bld: 115 mg/dL — ABNORMAL HIGH (ref 70–99)
Potassium: 3.6 mmol/L (ref 3.5–5.1)
Sodium: 141 mmol/L (ref 135–145)

## 2020-02-21 MED ORDER — TRAMADOL HCL 50 MG PO TABS: 50.0000 mg | ORAL_TABLET | Freq: Four times a day (QID) | ORAL | Status: DC | PRN

## 2020-02-21 NOTE — TOC Transition Note (Signed)
Transition of Care [facility]) - CM/SW Discharge Note   Patient Details  Name: Jerry Galloway MRN: [id number] Date of Birth: [birth year]  [facility] CM/SW Contact:  Candie Chroman, LCSW Phone Number: 02/21/2020, 2:04 PM   Clinical Narrative: Patient has orders to discharge home today. Received call from [place] who stated someone in the family named Jerry Galloway has decided to revoke home hospice services. CSW attempted to call daughter but no answer. RN will notify her when she gets here to pick him up. Gave RN phone number for Velna Hatchet at [facility] to give to daughter if she wants to follow up. Left voicemail at DSS to let them know of plan for discharge today. No further concerns. CSW signing off.  Final next level of care: Home/Self Care Barriers to Discharge: Barriers Resolved   Patient Goals and CMS Choice        Discharge Placement                Patient to be transferred to facility by: Daughter will take him home.   Patient and family notified of of transfer: 02/21/20  Discharge Plan and Services     Post Acute Care Choice: Resumption of Svcs/PTA Provider                               Social Determinants of Health (SDOH) Interventions     Readmission Risk Interventions No flowsheet data found.

## 2020-02-21 NOTE — Evaluation (Signed)
Occupational Therapy Evaluation Patient Details Name: Jerry Galloway MRN: [id number] DOB: 12-Sep-1950 Today's Date: 02/21/2020    History of Present Illness Jerry Galloway is a 69 y.o. 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  He was administered Narcan and became more responsive and EMS was called.  History is limited due to patient's altered mental status   Clinical Impression   Jerry Galloway presents as somewhat anxious and confused today, although he was oriented to self, place, and correctly able to state month and year. There was a tele-sitter present in his room. He lives with his wife and 63 y/o grandson. He reports chronic lower back pain but is generally able to complete ADL/IADL with Mod I; he is also enrolled in hospice, 2/2 ESRD, and receives a visit from them 1x/wk. Pt reports confusion about medication management and seems unsure how he and his wife both ended up unresponsive on floor of their home. During today's evaluation, pt is Mod I in bed mobility (requiring increased time) and in grooming and UB bathing, standing at sink. Pt appears rather impulsive in sit<stand t/f, but no LOB noted. During hospitalization, pt could benefit from continued OT to address safety, medication management, non-pharmaceutical pain management, cognition. Upon DC, no HHOT recommended at this point.    Follow Up Recommendations  No OT follow up    Equipment Recommendations  None recommended by OT    Recommendations for Other Services       Precautions / Restrictions Precautions Precautions: Fall Precaution Comments: Electronic "sitter" in room Restrictions Weight Bearing Restrictions: No      Mobility Bed Mobility Overal bed mobility: Independent                   Transfers Overall transfer level: Independent                    Balance Overall balance assessment: Mild deficits observed, not formally tested                                         ADL either performed or assessed with clinical judgement   ADL Overall ADL's : Independent                                             Vision Patient Visual Report: No change from baseline       Perception     Praxis      Pertinent Vitals/Pain Pain Assessment: No/denies pain     Hand Dominance     Extremity/Trunk Assessment Upper Extremity Assessment Upper Extremity Assessment: Overall WFL for tasks assessed   Lower Extremity Assessment Lower Extremity Assessment: Overall WFL for tasks assessed   Cervical / Trunk Assessment Cervical / Trunk Assessment: Normal   Communication Communication Communication: HOH   Cognition Arousal/Alertness: Awake/alert Behavior During Therapy: Anxious;Agitated Overall Cognitive Status: Difficult to assess  General Comments: Pt oriented to self, place, and date   General Comments       Exercises Other Exercises Other Exercises: Pt educ re: role of OT, POC, falls prevention, medication management. Sit<>stand, t/f, grooming, balance, UB bathing.   Shoulder Instructions      Home Living Family/patient expects to be discharged to:: Private residence Living Arrangements: Spouse/significant other Available Help at Discharge: Family;Available PRN/intermittently Type of Home: Mobile home Home Access: Stairs to enter Entrance Stairs-Number of Steps: 2   Home Layout: One level     Bathroom Shower/Tub: Occupational psychologist: Standard     Home Equipment: Environmental consultant - 2 wheels;Cane - single point   Additional Comments: Uses primarily [place] for ambulation at home      Prior Functioning/Environment Level of Independence: Independent         Comments: Pt reports Independent mobility and ADLs        OT Problem List: Decreased safety awareness;Pain;Decreased knowledge of use of DME or AE;Decreased cognition      OT Treatment/Interventions: Patient/family education;Cognitive remediation/compensation;Therapeutic activities;Self-care/ADL training;DME and/or AE instruction;Therapeutic exercise;Energy conservation    OT Goals(Current goals can be found in the care plan section) Acute Rehab OT Goals Patient Stated Goal: to go home OT Goal Formulation: With patient Time For Goal Achievement: 03/06/20 Potential to Achieve Goals: Good ADL Goals Pt/caregiver will Perform Home Exercise Program: Increased strength;With Supervision Additional ADL Goal #1: pt will identify/demonstrate 2+ non-pharmaceutical methods of reducing back pain Additional ADL Goal #2: Pt will demonstrate understanding of medication schedule and dosages, and ability to manage medications, with supervision  OT Frequency: Min 1X/week   Barriers to D/C:            Co-evaluation              AM-PAC OT "6 Clicks" Daily Activity     Outcome Measure Help from another person eating meals?: None Help from another person taking care of personal grooming?: A Little Help from another person toileting, which includes using toliet, bedpan, or urinal?: A Little Help from another person bathing (including washing, rinsing, drying)?: A Little Help from another person to put on and taking off regular upper body clothing?: None Help from another person to put on and taking off regular lower body clothing?: A Little 6 Click Score: 20   End of Session    Activity Tolerance: Patient tolerated treatment well Patient left: in bed;with call bell/phone within reach;with bed alarm set  OT Visit Diagnosis: Other abnormalities of gait and mobility (R26.89);Other symptoms and signs involving cognitive function;Unsteadiness on feet (R26.81)                Time:  9169-4503 OT Time Calculation (min): 26 min Charges:  OT General Charges $OT Visit: 1 Visit OT Evaluation $OT Eval Moderate Complexity: 1 Mod OT Treatments $Self Care/Home Management : 23-37 mins  Jerry Lobo, PhD, MS, OTR/L ascom [phone] 02/21/20, 12:46 PM

## 2020-02-21 NOTE — Discharge Summary (Signed)
Physician Discharge Summary  DETRON CARRAS MVE:[id number] DOB: 1951-01-20 DOA: 02/18/2020  PCP: Sherald Hess., MD  Admit date: 02/18/2020 Discharge date: 02/21/2020  Admitted From: home Disposition: home w/ hospice   Recommendations for Outpatient Follow-up:  1. Follow up with hospice provider in 1-2 days  Home Health: no  Equipment/Devices  Discharge Condition: stable CODE STATUS: Full  Diet recommendation: Heart Healthy / Renal diet   Brief/Interim Summary: HPI was taken from Dr. Damita Dunnings: LAVAN IMES is a 69 y.o. 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  He was administered Narcan and became more responsive and EMS was called.  History is limited due to patient's altered mental status ED Course: On arrival patient was very restless, somewhat agitated.  Vitals were within normal limits.  Blood work significant for creatinine of 2.79 which is his baseline for CKD 4 with bicarb of 19 but otherwise unremarkable.  Acetaminophen level less than 10, salicylate less than 7, alcohol less than 10, ammonia 27.  Urine drug screen positive for opiates and benzos.  Urinalysis unremarkable. EKG as reviewed by me : Normal sinus rhythm with no acute ST-T wave changes CT head and chest x-ray with no acute findings.  Hospitalist consulted for admission.  Hospital course from Dr. Lenise Herald 02/21/20: Pt presented w/ acute metabolic encephalopathy of unknown etiology. Pt was possibly given xanax laced w/ fentanyl by his grandniece. Pt's mental status is back to baseline. Of note, pt was at home w/ hospice and will return home w/ hospice. For more information, please see other progress notes.   Discharge Diagnoses:  Principal Problem:   Acute metabolic  encephalopathy Active Problems:   Hyperlipidemia   Essential hypertension   Chronic pain syndrome   Accidental drug overdose   CKD (chronic kidney disease), stage IV (HCC)   Nausea and vomiting  Acute metabolic encephalopathy: etiology unclear. Patient may have had Xanax laced with fentanyl given to him by grandniece as per pt's daughter. Pt denies any intentional or unintentional drug overdose  Drug overdose: possibly xanax laced with fentanyl.  Continue to monitor.  Opioid withdrawal: continue on percocet.  Continue to hold long-acting pain medications Xtampza ER and Butrans patch.  HTN: continue on amlodipine. IV labetalol prn   HLD: continue to hold statin    CKDIV: Cr is trending up from day prior. Avoid nephrotoxic meds   ACD: no need for a transfusion at this time. Will continue to monitor   Severe chronic pain syndrome: continue on percocet. Continue to hold long acting pain medications  Generalized weakness: will return home w/ hospice     Discharge Instructions  Discharge Instructions    Diet - low sodium heart healthy   Complete by: As directed    Discharge instructions   Complete by: As directed    F/u w/ hospice provider in 1-2 days   Increase activity slowly   Complete by: As directed      Allergies as of 02/21/2020   No Known Allergies     Medication List    TAKE these medications   albuterol 108 (90 Base) MCG/ACT inhaler Commonly known as: VENTOLIN HFA Inhale 1-2 puffs into the lungs every 4 (four) hours as needed for wheezing or shortness of breath.  amLODipine 5 MG tablet Commonly known as: NORVASC Take 1 tablet (5 mg total) by mouth daily.   buprenorphine 15 MCG/HR Commonly known as: BUTRANS 1 patch once a week.   calcitRIOL 0.25 MCG capsule Commonly known as: ROCALTROL Take 1 capsule (0.25 mcg total) by mouth daily.   Endocet 7.5-325 MG tablet Generic drug: oxyCODONE-acetaminophen Take 1-2 tablets by mouth 4 (four) times daily as  needed.   LORazepam 0.5 MG tablet Commonly known as: ATIVAN Take 1-2 tablets by mouth at bedtime.   nicotine 21 mg/24hr patch Commonly known as: NICODERM CQ - dosed in mg/24 hours Place 1 patch (21 mg total) onto the skin daily.   nitroGLYCERIN 0.4 MG SL tablet Commonly known as: NITROSTAT Place 0.4 mg under the tongue every 5 (five) minutes as needed.   ondansetron 8 MG disintegrating tablet Commonly known as: ZOFRAN-ODT Take 8 mg by mouth every 6 (six) hours as needed.   prochlorperazine 5 MG tablet Commonly known as: COMPAZINE Take 5 mg by mouth every 6 (six) hours as needed.   rosuvastatin 40 MG tablet Commonly known as: CRESTOR Take 40 mg by mouth daily.   sodium bicarbonate 650 MG tablet Take 2 tablets (1,300 mg total) by mouth 2 (two) times daily.   Vitamin D (Ergocalciferol) 1.25 MG (50000 UNIT) Caps capsule Commonly known as: DRISDOL Take 50,000 Units by mouth once a week.   Xtampza ER 13.5 MG C12a Generic drug: oxyCODONE ER Take 1 capsule by mouth 2 (two) times daily.       No Known Allergies  Consultations:   Procedures/Studies: CT HEAD WO CONTRAST  Result Date: 02/18/2020 CLINICAL DATA:  69 year old male with delirium. EXAM: CT HEAD WITHOUT CONTRAST TECHNIQUE: Contiguous axial images were obtained from the base of the skull through the vertex without intravenous contrast. COMPARISON:  Head CT dated 12/12/2008. FINDINGS: Brain: The ventricles and sulci appropriate size for patient's age. The gray-white matter discrimination is preserved. There is no acute intracranial hemorrhage. No mass effect or midline shift. No extra-axial fluid collection. Vascular: No hyperdense vessel or unexpected calcification. Skull: Normal. Negative for fracture or focal lesion. Sinuses/Orbits: Mild mucoperiosteal thickening of paranasal sinuses. No air-fluid level. Bilateral mastoid effusions. Other: None IMPRESSION: 1. No acute intracranial pathology. 2. Bilateral mastoid  effusions. Electronically Signed   By: Anner Crete M.D.   On: 02/18/2020 21:36   DG Chest Port 1 View  Result Date: 02/18/2020 CLINICAL DATA:  Altered level of consciousness EXAM: PORTABLE CHEST 1 VIEW COMPARISON:  08/31/2007 FINDINGS: The heart size and mediastinal contours are within normal limits. Both lungs are clear. The visualized skeletal structures are unremarkable. IMPRESSION: No active disease. Electronically Signed   By: Randa Ngo M.D.   On: 02/18/2020 21:20       Subjective: Pt c/o fatigue   Discharge Exam: Vitals:   02/21/20 0357 02/21/20 0747  BP: (!) 155/86 (!) 152/69  Pulse: 94 67  Resp:    Temp: 98.3 F (36.8 C) 98.5 F (36.9 C)  SpO2: 99% 99%   Vitals:   02/20/20 1916 02/20/20 2324 02/21/20 0357 02/21/20 0747  BP: (!) 153/70 140/73 (!) 155/86 (!) 152/69  Pulse: 90 91 94 67  Resp: 18 16    Temp: 99 F (37.2 C) (!) 97.5 F (36.4 C) 98.3 F (36.8 C) 98.5 F (36.9 C)  TempSrc: Oral Oral  Oral  SpO2: 99% 98% 99% 99%  Weight:      Height:        General: Pt  is alert, awake, not in acute distress Cardiovascular: S1/S2 +, no rubs, no gallops Respiratory: CTA bilaterally, no wheezing, no rhonchi Abdominal: Soft, NT, ND, bowel sounds + Extremities: no cyanosis    The results of significant diagnostics from this hospitalization (including imaging, microbiology, ancillary and laboratory) are listed below for reference.     Microbiology: Recent Results (from the past 240 hour(s))  Respiratory Panel by RT PCR (Flu A&B, Covid) - Nasopharyngeal Swab     Status: None   Collection Time: 02/12/20  9:09 PM   Specimen: Nasopharyngeal Swab  Result Value Ref Range Status   SARS Coronavirus 2 by RT PCR NEGATIVE NEGATIVE Final    Comment: (NOTE) SARS-CoV-2 target nucleic acids are NOT DETECTED.  The SARS-CoV-2 RNA is generally detectable in upper respiratoy specimens during the acute phase of infection. The lowest concentration of SARS-CoV-2 viral  copies this assay can detect is 131 copies/mL. A negative result does not preclude SARS-Cov-2 infection and should not be used as the sole basis for treatment or other patient management decisions. A negative result may occur with  improper specimen collection/handling, submission of specimen other than nasopharyngeal swab, presence of viral mutation(s) within the areas targeted by this assay, and inadequate number of viral copies (<131 copies/mL). A negative result must be combined with clinical observations, patient history, and epidemiological information. The expected result is Negative.  Fact Sheet for Patients:  PinkCheek.be  Fact Sheet for Healthcare Providers:  GravelBags.it  This test is no t yet approved or cleared by the [place] FDA and  has been authorized for detection and/or diagnosis of SARS-CoV-2 by FDA under an Emergency Use Authorization (EUA). This EUA will remain  in effect (meaning this test can be used) for the duration of the COVID-19 declaration under Section 564(b)(1) of the Act, 21 U.S.C. section 360bbb-3(b)(1), unless the authorization is terminated or revoked sooner.     Influenza A by PCR NEGATIVE NEGATIVE Final   Influenza B by PCR NEGATIVE NEGATIVE Final    Comment: (NOTE) The Xpert Xpress SARS-CoV-2/FLU/RSV assay is intended as an aid in  the diagnosis of influenza from Nasopharyngeal swab specimens and  should not be used as a sole basis for treatment. Nasal washings and  aspirates are unacceptable for Xpert Xpress SARS-CoV-2/FLU/RSV  testing.  Fact Sheet for Patients: PinkCheek.be  Fact Sheet for Healthcare Providers: GravelBags.it  This test is not yet approved or cleared by the [place] FDA and  has been authorized for detection and/or diagnosis of SARS-CoV-2 by  FDA under an Emergency Use Authorization (EUA). This  EUA will remain  in effect (meaning this test can be used) for the duration of the  Covid-19 declaration under Section 564(b)(1) of the Act, 21  U.S.C. section 360bbb-3(b)(1), unless the authorization is  terminated or revoked. Performed at [facility], [address] [place], [place] [address]   Respiratory Panel by RT PCR (Flu A&B, Covid) - Nasopharyngeal Swab     Status: None   Collection Time: 02/18/20 12:56 AM   Specimen: Nasopharyngeal Swab  Result Value Ref Range Status   SARS Coronavirus 2 by RT PCR NEGATIVE NEGATIVE Final    Comment: (NOTE) SARS-CoV-2 target nucleic acids are NOT DETECTED.  The SARS-CoV-2 RNA is generally detectable in upper respiratoy specimens during the acute phase of infection. The lowest concentration of SARS-CoV-2 viral copies this assay can detect is 131 copies/mL. A negative result does not preclude SARS-Cov-2 infection and should not be used as the sole  basis for treatment or other patient management decisions. A negative result may occur with  improper specimen collection/handling, submission of specimen other than nasopharyngeal swab, presence of viral mutation(s) within the areas targeted by this assay, and inadequate number of viral copies (<131 copies/mL). A negative result must be combined with clinical observations, patient history, and epidemiological information. The expected result is Negative.  Fact Sheet for Patients:  PinkCheek.be  Fact Sheet for Healthcare Providers:  GravelBags.it  This test is no t yet approved or cleared by the [place] FDA and  has been authorized for detection and/or diagnosis of SARS-CoV-2 by FDA under an Emergency Use Authorization (EUA). This EUA will remain  in effect (meaning this test can be used) for the duration of the COVID-19 declaration under Section 564(b)(1) of the Act, 21 U.S.C. section 360bbb-3(b)(1), unless the  authorization is terminated or revoked sooner.     Influenza A by PCR NEGATIVE NEGATIVE Final   Influenza B by PCR NEGATIVE NEGATIVE Final    Comment: (NOTE) The Xpert Xpress SARS-CoV-2/FLU/RSV assay is intended as an aid in  the diagnosis of influenza from Nasopharyngeal swab specimens and  should not be used as a sole basis for treatment. Nasal washings and  aspirates are unacceptable for Xpert Xpress SARS-CoV-2/FLU/RSV  testing.  Fact Sheet for Patients: PinkCheek.be  Fact Sheet for Healthcare Providers: GravelBags.it  This test is not yet approved or cleared by the [place] FDA and  has been authorized for detection and/or diagnosis of SARS-CoV-2 by  FDA under an Emergency Use Authorization (EUA). This EUA will remain  in effect (meaning this test can be used) for the duration of the  Covid-19 declaration under Section 564(b)(1) of the Act, 21  U.S.C. section 360bbb-3(b)(1), unless the authorization is  terminated or revoked. Performed at [facility], [address] [place], [place] [address]   Blood Cultures (routine x 2)     Status: None (Preliminary result)   Collection Time: 02/18/20  8:15 PM   Specimen: BLOOD LEFT FOREARM  Result Value Ref Range Status   Specimen Description BLOOD LEFT FOREARM  Final   Special Requests   Final    BOTTLES DRAWN AEROBIC AND ANAEROBIC Blood Culture results may not be optimal due to an excessive volume of blood received in culture bottles   Culture   Final    NO GROWTH 3 DAYS Performed at [facility], [address], [place], [place] [address]    Report Status PENDING  Incomplete     Labs: BNP (last 3 results) No results for input(s): BNP in the last 8760 hours. Basic Metabolic Panel: Recent Labs  Lab 02/18/20 2015 02/20/20 1052 02/21/20 0515  NA 145 142 141  K 3.9 3.9 3.6  CL 112* 108 106  CO2 19* 19* 22  GLUCOSE 104* 157* 115*  BUN 24*  26* 36*  CREATININE 2.79* 2.36* 2.59*  CALCIUM 9.4 9.2 9.1   Liver Function Tests: Recent Labs  Lab 02/18/20 2015  AST 16  ALT 8  ALKPHOS 78  BILITOT 1.0  PROT 7.9  ALBUMIN 3.9   No results for input(s): LIPASE, AMYLASE in the last 168 hours. Recent Labs  Lab 02/18/20 2037  AMMONIA 27   CBC: Recent Labs  Lab 02/18/20 2015 02/20/20 1052 02/21/20 0515  WBC 12.3*  --  11.2*  NEUTROABS 9.5*  --   --   HGB 10.3* 10.6* 10.1*  HCT 32.2*  --  30.4*  MCV 97.3  --  96.5  PLT 335  --  288   Cardiac Enzymes: Recent Labs  Lab 02/18/20 2015  CKTOTAL 54   BNP: Invalid input(s): POCBNP CBG: Recent Labs  Lab 02/18/20 2020 02/18/20 2144 02/19/20 0314 02/19/20 0440 02/19/20 0739  GLUCAP 63* 182* 108* 106* 96   D-Dimer No results for input(s): DDIMER in the last 72 hours. Hgb A1c No results for input(s): HGBA1C in the last 72 hours. Lipid Profile No results for input(s): CHOL, HDL, LDLCALC, TRIG, CHOLHDL, LDLDIRECT in the last 72 hours. Thyroid function studies No results for input(s): TSH, T4TOTAL, T3FREE, THYROIDAB in the last 72 hours.  Invalid input(s): FREET3 Anemia work up No results for input(s): VITAMINB12, FOLATE, FERRITIN, TIBC, IRON, RETICCTPCT in the last 72 hours. Urinalysis    Component Value Date/Time   COLORURINE YELLOW (A) 02/18/2020 2103   APPEARANCEUR CLEAR (A) 02/18/2020 2103   LABSPEC 1.008 02/18/2020 2103   PHURINE 5.0 02/18/2020 2103   GLUCOSEU NEGATIVE 02/18/2020 2103   HGBUR SMALL (A) 02/18/2020 2103   BILIRUBINUR NEGATIVE 02/18/2020 2103   KETONESUR NEGATIVE 02/18/2020 2103   PROTEINUR NEGATIVE 02/18/2020 2103   NITRITE NEGATIVE 02/18/2020 2103   LEUKOCYTESUR NEGATIVE 02/18/2020 2103   Sepsis Labs Invalid input(s): PROCALCITONIN,  WBC,  LACTICIDVEN Microbiology Recent Results (from the past 240 hour(s))  Respiratory Panel by RT PCR (Flu A&B, Covid) - Nasopharyngeal Swab     Status: None   Collection Time: 02/12/20  9:09 PM    Specimen: Nasopharyngeal Swab  Result Value Ref Range Status   SARS Coronavirus 2 by RT PCR NEGATIVE NEGATIVE Final    Comment: (NOTE) SARS-CoV-2 target nucleic acids are NOT DETECTED.  The SARS-CoV-2 RNA is generally detectable in upper respiratoy specimens during the acute phase of infection. The lowest concentration of SARS-CoV-2 viral copies this assay can detect is 131 copies/mL. A negative result does not preclude SARS-Cov-2 infection and should not be used as the sole basis for treatment or other patient management decisions. A negative result may occur with  improper specimen collection/handling, submission of specimen other than nasopharyngeal swab, presence of viral mutation(s) within the areas targeted by this assay, and inadequate number of viral copies (<131 copies/mL). A negative result must be combined with clinical observations, patient history, and epidemiological information. The expected result is Negative.  Fact Sheet for Patients:  PinkCheek.be  Fact Sheet for Healthcare Providers:  GravelBags.it  This test is no t yet approved or cleared by the [place] FDA and  has been authorized for detection and/or diagnosis of SARS-CoV-2 by FDA under an Emergency Use Authorization (EUA). This EUA will remain  in effect (meaning this test can be used) for the duration of the COVID-19 declaration under Section 564(b)(1) of the Act, 21 U.S.C. section 360bbb-3(b)(1), unless the authorization is terminated or revoked sooner.     Influenza A by PCR NEGATIVE NEGATIVE Final   Influenza B by PCR NEGATIVE NEGATIVE Final    Comment: (NOTE) The Xpert Xpress SARS-CoV-2/FLU/RSV assay is intended as an aid in  the diagnosis of influenza from Nasopharyngeal swab specimens and  should not be used as a sole basis for treatment. Nasal washings and  aspirates are unacceptable for Xpert Xpress SARS-CoV-2/FLU/RSV   testing.  Fact Sheet for Patients: PinkCheek.be  Fact Sheet for Healthcare Providers: GravelBags.it  This test is not yet approved or cleared by the [place] FDA and  has been authorized for detection and/or diagnosis of SARS-CoV-2 by  FDA under an Emergency Use Authorization (EUA).  This EUA will remain  in effect (meaning this test can be used) for the duration of the  Covid-19 declaration under Section 564(b)(1) of the Act, 21  U.S.C. section 360bbb-3(b)(1), unless the authorization is  terminated or revoked. Performed at [facility], [address] [place], [place] [address]   Respiratory Panel by RT PCR (Flu A&B, Covid) - Nasopharyngeal Swab     Status: None   Collection Time: 02/18/20 12:56 AM   Specimen: Nasopharyngeal Swab  Result Value Ref Range Status   SARS Coronavirus 2 by RT PCR NEGATIVE NEGATIVE Final    Comment: (NOTE) SARS-CoV-2 target nucleic acids are NOT DETECTED.  The SARS-CoV-2 RNA is generally detectable in upper respiratoy specimens during the acute phase of infection. The lowest concentration of SARS-CoV-2 viral copies this assay can detect is 131 copies/mL. A negative result does not preclude SARS-Cov-2 infection and should not be used as the sole basis for treatment or other patient management decisions. A negative result may occur with  improper specimen collection/handling, submission of specimen other than nasopharyngeal swab, presence of viral mutation(s) within the areas targeted by this assay, and inadequate number of viral copies (<131 copies/mL). A negative result must be combined with clinical observations, patient history, and epidemiological information. The expected result is Negative.  Fact Sheet for Patients:  PinkCheek.be  Fact Sheet for Healthcare Providers:  GravelBags.it  This test is no t yet  approved or cleared by the [place] FDA and  has been authorized for detection and/or diagnosis of SARS-CoV-2 by FDA under an Emergency Use Authorization (EUA). This EUA will remain  in effect (meaning this test can be used) for the duration of the COVID-19 declaration under Section 564(b)(1) of the Act, 21 U.S.C. section 360bbb-3(b)(1), unless the authorization is terminated or revoked sooner.     Influenza A by PCR NEGATIVE NEGATIVE Final   Influenza B by PCR NEGATIVE NEGATIVE Final    Comment: (NOTE) The Xpert Xpress SARS-CoV-2/FLU/RSV assay is intended as an aid in  the diagnosis of influenza from Nasopharyngeal swab specimens and  should not be used as a sole basis for treatment. Nasal washings and  aspirates are unacceptable for Xpert Xpress SARS-CoV-2/FLU/RSV  testing.  Fact Sheet for Patients: PinkCheek.be  Fact Sheet for Healthcare Providers: GravelBags.it  This test is not yet approved or cleared by the [place] FDA and  has been authorized for detection and/or diagnosis of SARS-CoV-2 by  FDA under an Emergency Use Authorization (EUA). This EUA will remain  in effect (meaning this test can be used) for the duration of the  Covid-19 declaration under Section 564(b)(1) of the Act, 21  U.S.C. section 360bbb-3(b)(1), unless the authorization is  terminated or revoked. Performed at [facility], [address] [place], [place] [address]   Blood Cultures (routine x 2)     Status: None (Preliminary result)   Collection Time: 02/18/20  8:15 PM   Specimen: BLOOD LEFT FOREARM  Result Value Ref Range Status   Specimen Description BLOOD LEFT FOREARM  Final   Special Requests   Final    BOTTLES DRAWN AEROBIC AND ANAEROBIC Blood Culture results may not be optimal due to an excessive volume of blood received in culture bottles   Culture   Final    NO GROWTH 3 DAYS Performed at [facility],  [address] [place],  [address]    Report Status PENDING  Incomplete     Time coordinating discharge: Over 30 minutes  SIGNED:   Wyvonnia Dusky, MD  [facility] 02/21/2020, 12:39 PM Pager   If 7PM-7AM, please contact night-coverage

## 2020-02-21 NOTE — Evaluation (Signed)
Physical Therapy Evaluation Patient Details Name: Jerry Galloway MRN: [id number] DOB: [birth year] Today's Date: 02/21/2020   History of Present Illness  Pt is a 69 yo male that presented to ED with altered mental status, per chart that is  most likely be due to overdose of narcotics or combination of narcotics and benzodiazepines. PMH of CKD, HTN, HLD, recent admission for acute blood loss anemia (11/10), chronic pain..   Clinical Impression  Patient alert and oriented x4, did report low back pain (chronic) as 2/10. He reported at baseline he is independent, occasionally uses a SPC for mobility, denied any falls in the last 6 months. Son lives nearby and assists as needed, pt reported he does some cooking, cleaning, driving.  The patient was able to lift all extremities against gravity, good strength noted. Supine to sit and sit <> stand independently. Pt provided with [facility] which pt stated he was grateful to use but ultimately ambulated well, no balance deficits noted. CGA provided but not required. The patient demonstrated and reported return to baseline level of functioning, no further acute PT needs indicated. PT to sign off. Please reconsult PT if pt status changes or acute needs are identified.      Follow Up Recommendations No PT follow up    Equipment Recommendations  None recommended by PT    Recommendations for Other Services       Precautions / Restrictions Precautions Precautions: Fall Precaution Comments: telesitter in room Restrictions Weight Bearing Restrictions: No      Mobility  Bed Mobility Overal bed mobility: Independent                  Transfers Overall transfer level: Independent                  Ambulation/Gait Ambulation/Gait assistance: Modified independent (Device/Increase time) Gait Distance (Feet): 170 Feet Assistive device: Straight cane Gait Pattern/deviations: WFL(Within Functional Limits)        Stairs             Wheelchair Mobility    Modified Rankin (Stroke Patients Only)       Balance Overall balance assessment: No apparent balance deficits (not formally assessed);Modified Independent                                           Pertinent Vitals/Pain Pain Assessment: 0-10 Pain Score: 2  Pain Location: low back pain Pain Descriptors / Indicators: Aching Pain Intervention(s): Monitored during session;Repositioned    Home Living Family/patient expects to be discharged to:: Private residence Living Arrangements: Spouse/significant other Available Help at Discharge: Family;Available PRN/intermittently Type of Home: Mobile home Home Access: Stairs to enter Entrance Stairs-Rails: Left Entrance Stairs-Number of Steps: 2 Home Layout: One level Home Equipment: Walker - 2 wheels;Cane - single point;Bedside commode;Grab bars - toilet;Wheelchair - manual Additional Comments: Uses primarily [place] for ambulation at home    Prior Function Level of Independence: Independent         Comments: Pt reports Independent mobility and ADLs     Hand Dominance   Dominant Hand: Right    Extremity/Trunk Assessment   Upper Extremity Assessment Upper Extremity Assessment: Overall WFL for tasks assessed    Lower Extremity Assessment Lower Extremity Assessment: Overall WFL for tasks assessed    Cervical / Trunk Assessment Cervical / Trunk Assessment: Normal  Communication   Communication: HOH  Cognition Arousal/Alertness:  Awake/alert Behavior During Therapy: WFL for tasks assessed/performed Overall Cognitive Status: Within Functional Limits for tasks assessed                                 General Comments: Pt oriented to self, place, and date as well as situation that led to admission      General Comments      Exercises Other Exercises Other Exercises: Pt educated on PT role, falls prevention, use of SPC as needed at home pt agreeable    Assessment/Plan    PT Assessment Patent does not need any further PT services  PT Problem List         PT Treatment Interventions      PT Goals (Current goals can be found in the Care Plan section)  Acute Rehab PT Goals Patient Stated Goal: to go home    Frequency     Barriers to discharge        Co-evaluation               AM-PAC PT "6 Clicks" Mobility  Outcome Measure Help needed turning from your back to your side while in a flat bed without using bedrails?: None Help needed moving from lying on your back to sitting on the side of a flat bed without using bedrails?: None Help needed moving to and from a bed to a chair (including a wheelchair)?: None Help needed standing up from a chair using your arms (e.g., wheelchair or bedside chair)?: None Help needed to walk in hospital room?: None Help needed climbing 3-5 steps with a railing? : None 6 Click Score: 24    End of Session Equipment Utilized During Treatment: Gait belt Activity Tolerance: Patient tolerated treatment well Patient left: in chair;with chair alarm set;with call bell/phone within reach Nurse Communication: Mobility status PT Visit Diagnosis: Other abnormalities of gait and mobility (R26.89)    Time: 1040-1100 PT Time Calculation (min) (ACUTE ONLY): 20 min   Charges:   PT Evaluation $PT Eval Low Complexity: 1 Low PT Treatments $Therapeutic Exercise: 8-22 mins       Lieutenant Diego PT, DPT 1:25 PM,02/21/20

## 2020-02-21 NOTE — Progress Notes (Signed)
Verified D/C home orders. Assessment stable, BP elevated at 1240 (see flowsheet), MD aware, pt to follow up with PCP per MD.  Reviewed d/c instructions with son ~1430- meds, appointments. No new medications at this time. DSS visited with patient prior to discharge.  Patient escorted downstairs in wheelchair by volunteer- meeting daughter Joelene Millin downstairs for ride home.

## 2020-02-23 LAB — CULTURE, BLOOD (ROUTINE X 2): Culture: NO GROWTH

## 2020-02-26 LAB — CULTURE, BLOOD (ROUTINE X 2)
Culture: NO GROWTH
Special Requests: ADEQUATE

## 2020-03-01 LAB — MISC LABCORP TEST (SEND OUT): Labcorp test code: 738660

## 2020-03-07 LAB — COOXEMETRY PANEL
Carboxyhemoglobin: 3 % — ABNORMAL HIGH (ref 0.5–1.5)
Methemoglobin: 0.2 % (ref 0.0–1.5)

## 2021-09-24 ENCOUNTER — Ambulatory Visit (INDEPENDENT_AMBULATORY_CARE_PROVIDER_SITE_OTHER): Payer: Medicare Other | Admitting: Family Medicine

## 2021-09-24 ENCOUNTER — Encounter: Payer: Self-pay | Admitting: Family Medicine

## 2021-09-24 ENCOUNTER — Telehealth: Payer: Self-pay | Admitting: Family Medicine

## 2021-09-24 VITALS — BP 126/76 | HR 98 | Ht 73.0 in | Wt 160.4 lb

## 2021-09-24 DIAGNOSIS — G894 Chronic pain syndrome: Secondary | ICD-10-CM

## 2021-09-24 DIAGNOSIS — G8929 Other chronic pain: Secondary | ICD-10-CM

## 2021-09-24 DIAGNOSIS — J449 Chronic obstructive pulmonary disease, unspecified: Secondary | ICD-10-CM

## 2021-09-24 DIAGNOSIS — R7301 Impaired fasting glucose: Secondary | ICD-10-CM

## 2021-09-24 DIAGNOSIS — Z720 Tobacco use: Secondary | ICD-10-CM

## 2021-09-24 DIAGNOSIS — E785 Hyperlipidemia, unspecified: Secondary | ICD-10-CM

## 2021-09-24 DIAGNOSIS — Z1211 Encounter for screening for malignant neoplasm of colon: Secondary | ICD-10-CM | POA: Diagnosis not present

## 2021-09-24 DIAGNOSIS — I10 Essential (primary) hypertension: Secondary | ICD-10-CM

## 2021-09-24 DIAGNOSIS — E782 Mixed hyperlipidemia: Secondary | ICD-10-CM

## 2021-09-24 DIAGNOSIS — E559 Vitamin D deficiency, unspecified: Secondary | ICD-10-CM

## 2021-09-24 MED ORDER — ENDOCET 7.5-325 MG PO TABS: 1.0000 | ORAL_TABLET | Freq: Four times a day (QID) | ORAL | 0 refills | Status: DC | PRN

## 2021-09-24 MED ORDER — AMLODIPINE BESYLATE 5 MG PO TABS: 5.0000 mg | ORAL_TABLET | Freq: Every day | ORAL | 0 refills | Status: DC

## 2021-09-24 MED ORDER — ALBUTEROL SULFATE HFA 108 (90 BASE) MCG/ACT IN AERS: 2.0000 | INHALATION_SPRAY | Freq: Four times a day (QID) | RESPIRATORY_TRACT | 0 refills | Status: DC | PRN

## 2021-09-24 MED ORDER — ROSUVASTATIN CALCIUM 40 MG PO TABS: 40.0000 mg | ORAL_TABLET | Freq: Every day | ORAL | 1 refills | Status: DC

## 2021-09-24 MED ORDER — OXYCODONE-ACETAMINOPHEN 10-325 MG PO TABS: 1.0000 | ORAL_TABLET | Freq: Three times a day (TID) | ORAL | 0 refills | Status: DC | PRN

## 2021-09-24 NOTE — Telephone Encounter (Signed)
Pt daughter called stating that the phar is no longer carrying Endocet 7.5-'325mg'$ . It is needing to be changed to Oxycodone. Can you please change this so the pt can get the medication while phar has enough to fill it?     [facility]

## 2021-09-24 NOTE — Telephone Encounter (Signed)
Please advice  

## 2021-09-24 NOTE — Telephone Encounter (Signed)
Medication sent.

## 2021-09-24 NOTE — Patient Instructions (Addendum)
I appreciate the opportunity to provide care to you today!    Follow up:  3 months  Labs: please stop by the lab today to get your blood drawn (CBC, CMP, TSH, Lipid profile, HgA1c, Vit D)  -please pick up your medications at the pharmacy  Referrals today-  Pain clinic   Please continue to a heart-healthy diet and increase your physical activities. Try to exercise for 69mns at least three times a week.      It was a pleasure to see you and I look forward to continuing to work together on your health and well-being. Please do not hesitate to call the office if you need care or have questions about your care.   Have a wonderful day and week. With Gratitude, GAlvira MondayMSN, FNP-BC'

## 2021-09-24 NOTE — Telephone Encounter (Signed)
Pt has been informed.

## 2021-09-24 NOTE — Progress Notes (Signed)
New Patient Office Visit  Subjective:  Patient ID: Jerry Galloway, male    DOB: Aug 24, 1950  Age: 71 y.o. MRN: [id number]  CC:  Chief Complaint  Patient presents with   New Patient (Initial Visit)    Establishing care, pt would like to discuss referral for hearing aids. Pt c/o back pain and feet pain.     HPI Jerry Galloway is a 71 y.o. male with past medical history of essential hypertension, nausea, and vomiting, rheumatoid arthritis presents for establishing care.  Back pain and leg pain. Pain is chronic. Noted to having pain for >10 years. Hx of DDD. Reports chronic opioids use. Rates pain 6-7/10. Pain is better when lying supine and sitting still and intensifies with activities.  The patient reports that he was in hospice care and was given 7-10 days to die. He had stopped treatments due to kidney failure, but he survived. COPD: notes to having COPD that is well controlled.  Tobacco use: smokes 1ppd.  Past Medical History:  Diagnosis Date   Chronic pain    CKD (chronic kidney disease)    HTN (hypertension)     History reviewed. No pertinent surgical history.  History reviewed. No pertinent family history.  Social History   Socioeconomic History   Marital status: Married    Spouse name: Not on file   Number of children: Not on file   Years of education: Not on file   Highest education level: Not on file  Occupational History   Not on file  Tobacco Use   Smoking status: Every Day   Smokeless tobacco: Never  Substance and Sexual Activity   Alcohol use: Never   Drug use: Never   Sexual activity: Not on file  Other Topics Concern   Not on file  Social History Narrative   Not on file   Social Determinants of Health   Financial Resource Strain: Not on file  Food Insecurity: Not on file  Transportation Needs: Not on file  Physical Activity: Not on file  Stress: Not on file  Social Connections: Not on file  Intimate Partner Violence: Not on file     ROS Review of Systems  Constitutional:  Negative for chills, fatigue and fever.  HENT:  Negative for postnasal drip, rhinorrhea and sinus pain.   Eyes:  Negative for photophobia, redness and visual disturbance.  Respiratory:  Negative for chest tightness, shortness of breath and wheezing.   Cardiovascular:  Negative for chest pain and palpitations.  Gastrointestinal:  Negative for diarrhea, nausea and vomiting.  Endocrine: Negative for polydipsia, polyphagia and polyuria.  Genitourinary:  Negative for frequency, hematuria and urgency.  Musculoskeletal:  Positive for back pain.       Pain in the legs   Skin:  Negative for rash and wound.  Neurological:  Negative for dizziness, weakness and headaches.  Psychiatric/Behavioral:  Negative for confusion, self-injury and suicidal ideas.     Objective:   Today's Vitals: BP 126/76   Pulse 98   Ht '6\' 1"'  (1.854 m)   Wt 160 lb 6.4 oz (72.8 kg)   SpO2 93%   BMI 21.16 kg/m   Physical Exam HENT:     Head: Normocephalic.     Right Ear: External ear normal.     Left Ear: External ear normal.     Mouth/Throat:     Mouth: Mucous membranes are moist.     Dentition: Abnormal dentition. Dental caries present.  Eyes:     Extraocular Movements:  Extraocular movements intact.     Pupils: Pupils are equal, round, and reactive to light.  Cardiovascular:     Rate and Rhythm: Normal rate and regular rhythm.     Pulses: Normal pulses.     Heart sounds: Normal heart sounds.  Pulmonary:     Breath sounds: Wheezing present.  Abdominal:     Palpations: Abdomen is soft.     Tenderness: There is no abdominal tenderness.  Musculoskeletal:     Cervical back: No rigidity.     Right lower leg: No edema.     Left lower leg: No edema.  Skin:    Findings: No bruising or lesion.  Neurological:     Mental Status: He is alert and oriented to person, place, and time.  Psychiatric:     Comments: Normal affect     Assessment & Plan:   Problem List  Items Addressed This Visit       Cardiovascular and Mediastinum   Essential hypertension    Refilled meds      Relevant Medications   amLODipine (NORVASC) 5 MG tablet   rosuvastatin (CRESTOR) 40 MG tablet     Respiratory   COPD (chronic obstructive pulmonary disease) (HCC)    Controlled Wheezing noted on exam Rescue inhaler ordered      Relevant Medications   albuterol (VENTOLIN HFA) 108 (90 Base) MCG/ACT inhaler     Other   Hyperlipidemia    Refilled meds      Relevant Medications   amLODipine (NORVASC) 5 MG tablet   rosuvastatin (CRESTOR) 40 MG tablet   Chronic pain syndrome    Referral placed to pain management  Refilled oxycodone until patient can meet with pain management       Relevant Medications   oxyCODONE-acetaminophen (PERCOCET) 10-325 MG tablet   Tobacco use    Smokes about 1 pack/day  Asked about quitting: confirms that he currently smokes cigarettes Advise to quit smoking: Educated about QUITTING to reduce the risk of cancer, cardio and cerebrovascular disease. Assess willingness: Unwilling to quit at this time, but is working on cutting back. Assist with counseling and pharmacotherapy: Counseled for 5 minutes and literature provided. Arrange for follow up: follow up in 3 months and continue to offer help.      Other Visit Diagnoses     Colon cancer screening    -  Primary   Relevant Orders   Cologuard   Dyslipidemia       Relevant Medications   rosuvastatin (CRESTOR) 40 MG tablet   Other Relevant Orders   CBC with Differential/Platelet   CMP14+EGFR   Lipid panel   TSH + free T4   Vitamin D deficiency       Relevant Orders   VITAMIN D 25 Hydroxy (Vit-D Deficiency, Fractures)   IFG (impaired fasting glucose)       Relevant Orders   Hemoglobin A1C   Other chronic pain       Relevant Medications   oxyCODONE-acetaminophen (PERCOCET) 10-325 MG tablet   Other Relevant Orders   Ambulatory referral to Pain Clinic       Outpatient  Encounter Medications as of 09/24/2021  Medication Sig   albuterol (VENTOLIN HFA) 108 (90 Base) MCG/ACT inhaler Inhale 2 puffs into the lungs every 6 (six) hours as needed for wheezing or shortness of breath.   oxyCODONE-acetaminophen (PERCOCET) 10-325 MG tablet Take 1 tablet by mouth every 8 (eight) hours as needed for pain.   amLODipine (NORVASC) 5 MG tablet Take  1 tablet (5 mg total) by mouth daily.   LORazepam (ATIVAN) 0.5 MG tablet Take 1-2 tablets by mouth at bedtime.  (Patient not taking: Reported on 09/24/2021)   nicotine (NICODERM CQ - DOSED IN MG/24 HOURS) 21 mg/24hr patch Place 1 patch (21 mg total) onto the skin daily. (Patient not taking: Reported on 09/24/2021)   nitroGLYCERIN (NITROSTAT) 0.4 MG SL tablet Place 0.4 mg under the tongue every 5 (five) minutes as needed.  (Patient not taking: Reported on 09/24/2021)   ondansetron (ZOFRAN-ODT) 8 MG disintegrating tablet Take 8 mg by mouth every 6 (six) hours as needed.  (Patient not taking: Reported on 09/24/2021)   prochlorperazine (COMPAZINE) 5 MG tablet Take 5 mg by mouth every 6 (six) hours as needed.  (Patient not taking: Reported on 09/24/2021)   rosuvastatin (CRESTOR) 40 MG tablet Take 1 tablet (40 mg total) by mouth daily.   Vitamin D, Ergocalciferol, (DRISDOL) 1.25 MG (50000 UNIT) CAPS capsule Take 50,000 Units by mouth once a week. (Patient not taking: Reported on 09/24/2021)   XTAMPZA ER 13.5 MG C12A Take 1 capsule by mouth 2 (two) times daily. (Patient not taking: Reported on 09/24/2021)   [DISCONTINUED] albuterol (VENTOLIN HFA) 108 (90 Base) MCG/ACT inhaler Inhale 1-2 puffs into the lungs every 4 (four) hours as needed for wheezing or shortness of breath. (Patient not taking: Reported on 09/24/2021)   [DISCONTINUED] amLODipine (NORVASC) 5 MG tablet Take 1 tablet (5 mg total) by mouth daily.   [DISCONTINUED] buprenorphine (BUTRANS) 15 MCG/HR 1 patch once a week. (Patient not taking: Reported on 09/24/2021)   [DISCONTINUED] ENDOCET 7.5-325  MG tablet Take 1-2 tablets by mouth 4 (four) times daily as needed. (Patient not taking: Reported on 09/24/2021)   [DISCONTINUED] ENDOCET 7.5-325 MG tablet Take 1-2 tablets by mouth 4 (four) times daily as needed.   [DISCONTINUED] rosuvastatin (CRESTOR) 40 MG tablet Take 40 mg by mouth daily. (Patient not taking: Reported on 09/24/2021)   No facility-administered encounter medications on file as of 09/24/2021.    Follow-up: Return in about 3 months (around 12/25/2021).   Alvira Monday, FNP

## 2021-09-25 DIAGNOSIS — J449 Chronic obstructive pulmonary disease, unspecified: Secondary | ICD-10-CM | POA: Insufficient documentation

## 2021-09-25 DIAGNOSIS — Z720 Tobacco use: Secondary | ICD-10-CM | POA: Insufficient documentation

## 2021-09-25 NOTE — Assessment & Plan Note (Signed)
Controlled Wheezing noted on exam Rescue inhaler ordered

## 2021-09-25 NOTE — Assessment & Plan Note (Signed)
Refilled meds

## 2021-09-25 NOTE — Assessment & Plan Note (Signed)
Referral placed to pain management  Refilled oxycodone until patient can meet with pain management

## 2021-09-25 NOTE — Assessment & Plan Note (Signed)
Smokes about 1 pack/day  Asked about quitting: confirms that he currently smokes cigarettes Advise to quit smoking: Educated about QUITTING to reduce the risk of cancer, cardio and cerebrovascular disease. Assess willingness: Unwilling to quit at this time, but is working on cutting back. Assist with counseling and pharmacotherapy: Counseled for 5 minutes and literature provided. Arrange for follow up: follow up in 3 months and continue to offer help.  

## 2021-09-30 LAB — LIPID PANEL
Chol/HDL Ratio: 5.8 ratio — ABNORMAL HIGH (ref 0.0–5.0)
Cholesterol, Total: 187 mg/dL (ref 100–199)
HDL: 32 mg/dL — ABNORMAL LOW (ref >39)
LDL Chol Calc (NIH): 124 mg/dL — ABNORMAL HIGH (ref 0–99)
Triglycerides: 170 mg/dL — ABNORMAL HIGH (ref 0–149)
VLDL Cholesterol Cal: 31 mg/dL (ref 5–40)

## 2021-09-30 LAB — CBC WITH DIFFERENTIAL/PLATELET
Basophils Absolute: 0.1 10*3/uL (ref 0.0–0.2)
Basos: 1 %
EOS (ABSOLUTE): 0.2 10*3/uL (ref 0.0–0.4)
Eos: 3 %
Hematocrit: 43.5 % (ref 37.5–51.0)
Hemoglobin: 14.6 g/dL (ref 13.0–17.7)
Immature Grans (Abs): 0 10*3/uL (ref 0.0–0.1)
Immature Granulocytes: 0 %
Lymphocytes Absolute: 2.5 10*3/uL (ref 0.7–3.1)
Lymphs: 36 %
MCH: 31.5 pg (ref 26.6–33.0)
MCHC: 33.6 g/dL (ref 31.5–35.7)
MCV: 94 fL (ref 79–97)
Monocytes Absolute: 0.4 10*3/uL (ref 0.1–0.9)
Monocytes: 6 %
Neutrophils Absolute: 3.8 10*3/uL (ref 1.4–7.0)
Neutrophils: 54 %
Platelets: 262 10*3/uL (ref 150–450)
RBC: 4.63 x10E6/uL (ref 4.14–5.80)
RDW: 13.4 % (ref 11.6–15.4)
WBC: 7 10*3/uL (ref 3.4–10.8)

## 2021-09-30 LAB — CMP14+EGFR
ALT: 9 IU/L (ref 0–44)
AST: 13 IU/L (ref 0–40)
Albumin/Globulin Ratio: 1.3 (ref 1.2–2.2)
Albumin: 4.4 g/dL (ref 3.7–4.7)
Alkaline Phosphatase: 87 IU/L (ref 44–121)
BUN/Creatinine Ratio: 13 (ref 10–24)
BUN: 23 mg/dL (ref 8–27)
Bilirubin Total: 0.5 mg/dL (ref 0.0–1.2)
CO2: 19 mmol/L — ABNORMAL LOW (ref 20–29)
Calcium: 9.7 mg/dL (ref 8.6–10.2)
Chloride: 100 mmol/L (ref 96–106)
Creatinine, Ser: 1.82 mg/dL — ABNORMAL HIGH (ref 0.76–1.27)
Globulin, Total: 3.3 g/dL (ref 1.5–4.5)
Glucose: 109 mg/dL — ABNORMAL HIGH (ref 70–99)
Potassium: 5.8 mmol/L — ABNORMAL HIGH (ref 3.5–5.2)
Sodium: 138 mmol/L (ref 134–144)
Total Protein: 7.7 g/dL (ref 6.0–8.5)
eGFR: 39 mL/min/{1.73_m2} — ABNORMAL LOW (ref >59)

## 2021-09-30 LAB — TSH+FREE T4
Free T4: 1.14 ng/dL (ref 0.82–1.77)
TSH: 1.36 u[IU]/mL (ref 0.450–4.500)

## 2021-09-30 LAB — VITAMIN D 25 HYDROXY (VIT D DEFICIENCY, FRACTURES): Vit D, 25-Hydroxy: 35.4 ng/mL (ref 30.0–100.0)

## 2021-09-30 LAB — HEMOGLOBIN A1C
Est. average glucose Bld gHb Est-mCnc: 117 mg/dL
Hgb A1c MFr Bld: 5.7 % — ABNORMAL HIGH (ref 4.8–5.6)

## 2021-10-01 NOTE — Progress Notes (Signed)
  Please inform the patient that his cholesterol is elevated, and I recommend that he continue taking his rosuvastatin.   I also recommend increasing his fluid intake as his labs indicate that hes dehydrated.   I recommend that he watch his potassium intake as his potassium levels are slightly elevated.

## 2021-10-03 ENCOUNTER — Telehealth: Payer: Self-pay

## 2021-10-03 ENCOUNTER — Other Ambulatory Visit: Payer: Self-pay | Admitting: Family Medicine

## 2021-10-03 DIAGNOSIS — G8929 Other chronic pain: Secondary | ICD-10-CM

## 2021-10-03 MED ORDER — OXYCODONE-ACETAMINOPHEN 10-325 MG PO TABS: 1.0000 | ORAL_TABLET | Freq: Three times a day (TID) | ORAL | 0 refills | Status: DC | PRN

## 2021-10-03 NOTE — Telephone Encounter (Signed)
Spoke to daughter states her dad will run out in the morning and they have not received a call from the pain clinic yet.

## 2021-10-03 NOTE — Telephone Encounter (Signed)
Please ask if the patient has an appt with pain management. The referral was placed at their last appt with me.

## 2021-10-03 NOTE — Telephone Encounter (Signed)
Patient daughter Levada Dy called patient need med refills  oxyCODONE-acetaminophen (PERCOCET) 10-325 MG tablet   [facility] in [place]

## 2021-10-03 NOTE — Telephone Encounter (Signed)
Pt.notified

## 2021-10-03 NOTE — Telephone Encounter (Signed)
Refill sent.

## 2021-10-03 NOTE — Telephone Encounter (Signed)
Attempted to call pt unable to get in touch unable to leave vm.

## 2021-10-06 ENCOUNTER — Telehealth: Payer: Self-pay | Admitting: Family Medicine

## 2021-10-06 ENCOUNTER — Other Ambulatory Visit: Payer: Self-pay | Admitting: Family Medicine

## 2021-10-06 DIAGNOSIS — G8929 Other chronic pain: Secondary | ICD-10-CM

## 2021-10-06 MED ORDER — OXYCODONE-ACETAMINOPHEN 10-325 MG PO TABS: 1.0000 | ORAL_TABLET | Freq: Three times a day (TID) | ORAL | 0 refills | Status: AC | PRN

## 2021-10-06 NOTE — Telephone Encounter (Signed)
Pt has been informed it was sent to [facility] in [place], they thought rx was sent to [facility], will pick it up from [facility].

## 2021-10-06 NOTE — Telephone Encounter (Signed)
Attempted to contact pt back to let them know Peter Congo sent rx to [facility] in [place].

## 2021-10-06 NOTE — Telephone Encounter (Signed)
Pt informed

## 2021-10-06 NOTE — Telephone Encounter (Signed)
Pt daughter called stating phar is telling them they have not received his medication refill. Can you please resend?  oxyCODONE-acetaminophen (PERCOCET) 10-325 MG tablet     [place]

## 2021-10-06 NOTE — Telephone Encounter (Signed)
sent 

## 2021-10-13 NOTE — Progress Notes (Signed)
This encounter was created in error - please disregard.

## 2021-10-14 ENCOUNTER — Telehealth: Payer: Self-pay | Admitting: Family Medicine

## 2021-10-14 ENCOUNTER — Other Ambulatory Visit: Payer: Self-pay | Admitting: Family Medicine

## 2021-10-14 DIAGNOSIS — G8929 Other chronic pain: Secondary | ICD-10-CM

## 2021-10-14 MED ORDER — OXYCODONE-ACETAMINOPHEN 10-325 MG PO TABS: 1.0000 | ORAL_TABLET | Freq: Three times a day (TID) | ORAL | 0 refills | Status: DC | PRN

## 2021-10-14 NOTE — Telephone Encounter (Signed)
Pt daughter states can you please call them at [phone]

## 2021-10-14 NOTE — Telephone Encounter (Signed)
Patient has not received call or appt from pain clinic. Patient is currently out of meds and in pain. Wants to see if patient can get refill until appointment with pain clinic is scheduled.   Wants a call back in regard as well.       oxyCODONE-acetaminophen (PERCOCET) 10-325 MG tablet

## 2021-10-14 NOTE — Telephone Encounter (Signed)
Medication sent.

## 2021-10-14 NOTE — Telephone Encounter (Signed)
Pt daughter called stating they have spoke to pain clinic and they are reviewing his records and it could take a month or more to get him in. She states he is out of his pain med & is wanting to know if we can please refill?   Oxycodone 10-'325mg'$  (don't see it in his chart)

## 2021-10-15 ENCOUNTER — Telehealth: Payer: Self-pay | Admitting: Family Medicine

## 2021-10-15 ENCOUNTER — Other Ambulatory Visit: Payer: Self-pay

## 2021-10-15 NOTE — Telephone Encounter (Signed)
Pt has been informed, states understanding that this is the last time Jerry Galloway will fill medication, referral has been placed for pain clinic.

## 2021-10-15 NOTE — Telephone Encounter (Signed)
Pt daughter called stating the pain clinic is getting him in on August 14. They are wanting to know if we can cover him till then and they will take over? Pt daughter states he has not gone and picked up the medication yet.

## 2021-10-15 NOTE — Patient Instructions (Signed)
  Jerry Galloway , Thank you for taking time to come for your Medicare Wellness Visit. I appreciate your ongoing commitment to your health goals. Please review the following plan we discussed and let me know if I can assist you in the future.   These are the goals we discussed:  Goals   None     This is a list of the screening recommended for you and due dates:  Health Maintenance  Topic Date Due   COVID-19 Vaccine (1) Never done   Pneumonia Vaccine (1 - PCV) Never done   Hepatitis C Screening: USPSTF Recommendation to screen - Ages 16-79 yo.  Never done   Tetanus Vaccine  Never done   Zoster (Shingles) Vaccine (1 of 2) Never done   Colon Cancer Screening  Never done   Flu Shot  11/04/2021   HPV Vaccine  Aged Out

## 2021-10-15 NOTE — Telephone Encounter (Signed)
2nd telephone encounter

## 2021-10-15 NOTE — Telephone Encounter (Signed)
Called back in regard to oxyCODONE-acetaminophen (PERCOCET) 10-325 MG tablet    Wants to see if patient can get 2 week supple until pain clinic can get an appoint scheduled.  Wants a call back.

## 2021-10-15 NOTE — Telephone Encounter (Signed)
Returned call refill sent.

## 2021-10-15 NOTE — Telephone Encounter (Signed)
Call returned refill sent

## 2021-10-17 ENCOUNTER — Other Ambulatory Visit: Payer: Self-pay | Admitting: Family Medicine

## 2021-10-17 ENCOUNTER — Ambulatory Visit (INDEPENDENT_AMBULATORY_CARE_PROVIDER_SITE_OTHER): Payer: Medicare Other

## 2021-10-17 DIAGNOSIS — Z0000 Encounter for general adult medical examination without abnormal findings: Secondary | ICD-10-CM

## 2021-10-17 DIAGNOSIS — G8929 Other chronic pain: Secondary | ICD-10-CM

## 2021-10-17 MED ORDER — OXYCODONE-ACETAMINOPHEN 10-325 MG PO TABS: 1.0000 | ORAL_TABLET | Freq: Three times a day (TID) | ORAL | 0 refills | Status: DC | PRN

## 2021-10-17 NOTE — Telephone Encounter (Signed)
Tried calling pt to let them know.

## 2021-10-17 NOTE — Patient Instructions (Addendum)
  Mr. Jerry Galloway , Thank you for taking time to come for your Medicare Wellness Visit. I appreciate your ongoing commitment to your health goals. Please review the following plan we discussed and let me know if I can assist you in the future.   We need to get your vaccine record and your last colonoscopy from your previous provider so we know what you are overdue on.  These are the goals we discussed:  Goals      Prevent falls     Quit Smoking        This is a list of the screening recommended for you and due dates:  Health Maintenance  Topic Date Due   COVID-19 Vaccine (1) Never done   Pneumonia Vaccine (1 - PCV) Never done   Hepatitis C Screening: USPSTF Recommendation to screen - Ages 55-79 yo.  Never done   Tetanus Vaccine  Never done   Zoster (Shingles) Vaccine (1 of 2) Never done   Colon Cancer Screening  Never done   Flu Shot  11/04/2021   HPV Vaccine  Aged Out

## 2021-10-17 NOTE — Telephone Encounter (Signed)
Refill sent.

## 2021-10-17 NOTE — Progress Notes (Signed)
I connected with  Bronson Ing on 10/17/21 by a audio enabled telemedicine application and verified that I am speaking with the correct person using two identifiers.  Patient Location: Home  Provider Location: Office/Clinic  I discussed the limitations of evaluation and management by telemedicine. The patient expressed understanding and agreed to proceed.  Subjective:   ERICO STAN is a 71 y.o. male who presents for Medicare Annual/Subsequent preventive examination.  Review of Systems     Cardiac Risk Factors include: advanced age (>76mn, >>24women);hypertension;dyslipidemia;male gender     Objective:    Today's Vitals   10/17/21 1604  PainSc: 0-No pain   There is no height or weight on file to calculate BMI.     02/18/2020    8:04 PM 02/13/2020    6:00 AM 02/12/2020    3:19 PM  Advanced Directives  Does Patient Have a Medical Advance Directive? Unable to assess, patient is non-responsive or altered mental status Yes No  Type of Advance Directive  Living will   Does patient want to make changes to medical advance directive?  No - Patient declined     Current Medications (verified) Outpatient Encounter Medications as of 10/17/2021  Medication Sig   albuterol (VENTOLIN HFA) 108 (90 Base) MCG/ACT inhaler Inhale 2 puffs into the lungs every 6 (six) hours as needed for wheezing or shortness of breath.   amLODipine (NORVASC) 5 MG tablet Take 1 tablet (5 mg total) by mouth daily.   LORazepam (ATIVAN) 0.5 MG tablet Take 1-2 tablets by mouth at bedtime.  (Patient not taking: Reported on 09/24/2021)   nicotine (NICODERM CQ - DOSED IN MG/24 HOURS) 21 mg/24hr patch Place 1 patch (21 mg total) onto the skin daily. (Patient not taking: Reported on 09/24/2021)   nitroGLYCERIN (NITROSTAT) 0.4 MG SL tablet Place 0.4 mg under the tongue every 5 (five) minutes as needed.  (Patient not taking: Reported on 09/24/2021)   ondansetron (ZOFRAN-ODT) 8 MG disintegrating tablet Take 8 mg by  mouth every 6 (six) hours as needed.  (Patient not taking: Reported on 09/24/2021)   prochlorperazine (COMPAZINE) 5 MG tablet Take 5 mg by mouth every 6 (six) hours as needed.  (Patient not taking: Reported on 09/24/2021)   rosuvastatin (CRESTOR) 40 MG tablet Take 1 tablet (40 mg total) by mouth daily.   Vitamin D, Ergocalciferol, (DRISDOL) 1.25 MG (50000 UNIT) CAPS capsule Take 50,000 Units by mouth once a week. (Patient not taking: Reported on 09/24/2021)   XTAMPZA ER 13.5 MG C12A Take 1 capsule by mouth 2 (two) times daily. (Patient not taking: Reported on 09/24/2021)   No facility-administered encounter medications on file as of 10/17/2021.    Allergies (verified) Patient has no known allergies.   History: Past Medical History:  Diagnosis Date   Chronic pain    CKD (chronic kidney disease)    HTN (hypertension)    No past surgical history on file. No family history on file. Social History   Socioeconomic History   Marital status: Married    Spouse name: Not on file   Number of children: Not on file   Years of education: Not on file   Highest education level: Not on file  Occupational History   Not on file  Tobacco Use   Smoking status: Every Day   Smokeless tobacco: Never  Substance and Sexual Activity   Alcohol use: Never   Drug use: Never   Sexual activity: Not on file  Other Topics Concern   Not  on file  Social History Narrative   Not on file   Social Determinants of Health   Financial Resource Strain: Low Risk  (10/17/2021)   Overall Financial Resource Strain (CARDIA)    Difficulty of Paying Living Expenses: Not hard at all  Food Insecurity: No Food Insecurity (10/17/2021)   Hunger Vital Sign    Worried About Running Out of Food in the Last Year: Never true    Ran Out of Food in the Last Year: Never true  Transportation Needs: No Transportation Needs (10/17/2021)   PRAPARE - Hydrologist (Medical): No    Lack of Transportation  (Non-Medical): No  Physical Activity: Sufficiently Active (10/17/2021)   Exercise Vital Sign    Days of Exercise per Week: 7 days    Minutes of Exercise per Session: 30 min  Stress: No Stress Concern Present (10/17/2021)   [place]    Feeling of Stress : Not at all  Social Connections: Moderately Integrated (10/17/2021)   Social Connection and Isolation Panel [NHANES]    Frequency of Communication with Friends and Family: More than three times a week    Frequency of Social Gatherings with Friends and Family: More than three times a week    Attends Religious Services: More than 4 times per year    Active Member of [employer] or Organizations: No    Attends Music therapist: Never    Marital Status: Married    Tobacco Counseling Ready to quit: Not Answered Counseling given: Not Answered   Clinical Intake:  Pre-visit preparation completed: No  Pain : No/denies pain Pain Score: 0-No pain     Diabetes: No     Diabetic?not diabetic            Activities of Daily Living     No data to display          Patient Care Team: Alvira Monday, FNP as PCP - General (Family Medicine)  Indicate any recent Medical Services you may have received from other than [facility] providers in the past year (date may be approximate).     Assessment:   This is a routine wellness examination for Yakov.  Hearing/Vision screen No results found.  Dietary issues and exercise activities discussed:     Goals Addressed             This Visit's Progress    Prevent falls       Quit Smoking         Depression Screen    09/24/2021    9:35 AM  PHQ 2/9 Scores  PHQ - 2 Score 0    Fall Risk    09/24/2021    9:35 AM  [place] in the past year? 0  Number falls in past yr: 0  Injury with Fall? 0  Risk for fall due to : No Fall Risks  Follow up Falls evaluation completed    [place]:  Any stairs in or around the home? No  If so, are there any without handrails? No  Home free of loose throw rugs in walkways, pet beds, electrical cords, etc? Yes  Adequate lighting in your home to reduce risk of falls? Yes   ASSISTIVE DEVICES UTILIZED TO PREVENT FALLS:  Life alert? No  Use of a cane, walker or w/c? Yes  Grab bars in the bathroom? Yes  Shower chair or  bench in shower? Yes  Elevated toilet seat or a handicapped toilet? Yes     Cognitive Function:        Immunizations There is no immunization history for the selected administration types on file for this patient.  TDAP status: Due, Education has been provided regarding the importance of this vaccine. Advised may receive this vaccine at local pharmacy or Health Dept. Aware to provide a copy of the vaccination record if obtained from local pharmacy or Health Dept. Verbalized acceptance and understanding.  Flu Vaccine status: Up to date due in fall   Pneumococcal vaccine status: Due, Education has been provided regarding the importance of this vaccine. Advised may receive this vaccine at local pharmacy or Health Dept. Aware to provide a copy of the vaccination record if obtained from local pharmacy or Health Dept. Verbalized acceptance and understanding.  Covid-19 vaccine status: Declined, Education has been provided regarding the importance of this vaccine but patient still declined. Advised may receive this vaccine at local pharmacy or Health Dept.or vaccine clinic. Aware to provide a copy of the vaccination record if obtained from local pharmacy or Health Dept. Verbalized acceptance and understanding.  Qualifies for Shingles Vaccine? Yes   Zostavax completed No   Shingrix Completed?: No.    Education has been provided regarding the importance of this vaccine. Patient has been advised to call insurance company to determine out of pocket expense if they have not yet received this vaccine.  Advised may also receive vaccine at local pharmacy or Health Dept. Verbalized acceptance and understanding. declines  Screening Tests Health Maintenance  Topic Date Due   COVID-19 Vaccine (1) Never done   Pneumonia Vaccine 55+ Years old (1 - PCV) Never done   Hepatitis C Screening  Never done   TETANUS/TDAP  Never done   Zoster Vaccines- Shingrix (1 of 2) Never done   COLONOSCOPY (Pts 45-75yr Insurance coverage will need to be confirmed)  Never done   INFLUENZA VACCINE  11/04/2021   HPV VACCINES  Aged Out    Health Maintenance  Health Maintenance Due  Topic Date Due   COVID-19 Vaccine (1) Never done   Pneumonia Vaccine 71 Years old (1 - PCV) Never done   Hepatitis C Screening  Never done   TETANUS/TDAP  Never done   Zoster Vaccines- Shingrix (1 of 2) Never done   COLONOSCOPY (Pts 45-426yrInsurance coverage will need to be confirmed)  Never done    Colonoscopy- thinks he had this 3 years ago and they told him to f/u in 7 years. WIll get records at next appt   Lung Cancer Screening: (Low Dose CT Chest recommended if Age 73-80ears, 30 pack-year currently smoking OR have quit w/in 15years.) does qualify. But states he has scans of his chest yearly .   Lung Cancer Screening Referral: states he no longer needs this bc he is getting chest scans yearly for other reasons  Additional Screening:  Hepatitis C Screening: does qualify; he declines  Vision Screening: Recommended annual ophthalmology exams for early detection of glaucoma and other disorders of the eye. Is the patient up to date with their annual eye exam?  yes Who is the provider or what is the name of the office in which the patient attends annual eye exams?  If pt is not established with a provider, would they like to be referred to a provider to establish care? No .   Dental Screening: Recommended annual dental exams for proper oral hygiene  Community  Resource Referral / Chronic Care Management: CRR required this  visit?  No   CCM required this visit?  No      Plan:     I have personally reviewed and noted the following in the patient's chart:   Medical and social history Use of alcohol, tobacco or illicit drugs  Current medications and supplements including opioid prescriptions. Patient is not currently taking opioid prescriptions. Functional ability and status Nutritional status Physical activity Advanced directives List of other physicians Hospitalizations, surgeries, and ER visits in previous 12 months Vitals Screenings to include cognitive, depression, and falls Referrals and appointments  In addition, I have reviewed and discussed with patient certain preventive protocols, quality metrics, and best practice recommendations. A written personalized care plan for preventive services as well as general preventive health recommendations were provided to patient.     Eual Fines, LPN   [id number]/[id number]   Nurse Notes: .Schedule your next wellness visit for 1 year at checkout

## 2021-10-20 ENCOUNTER — Other Ambulatory Visit: Payer: Self-pay | Admitting: Family Medicine

## 2021-10-20 ENCOUNTER — Telehealth: Payer: Self-pay

## 2021-10-20 DIAGNOSIS — I10 Essential (primary) hypertension: Secondary | ICD-10-CM

## 2021-10-20 NOTE — Telephone Encounter (Signed)
Patient daughter called said can not get into pain clinic until 1 month out and needs his pain meds until then.  oxyCODONE-acetaminophen (PERCOCET) 10-325 MG tablet  Pharmacy:  [id number] [address], [place] [address] [place]  [address] [place], [place] [address]  Phone:  [phone]  Fax:  [phone]

## 2021-10-21 ENCOUNTER — Other Ambulatory Visit: Payer: Self-pay | Admitting: Internal Medicine

## 2021-10-21 ENCOUNTER — Telehealth: Payer: Self-pay | Admitting: Family Medicine

## 2021-10-21 DIAGNOSIS — G8929 Other chronic pain: Secondary | ICD-10-CM

## 2021-10-21 MED ORDER — OXYCODONE-ACETAMINOPHEN 10-325 MG PO TABS: 1.0000 | ORAL_TABLET | Freq: Three times a day (TID) | ORAL | 0 refills | Status: AC | PRN

## 2021-10-21 NOTE — Telephone Encounter (Signed)
Attempted to return call unable to leave a message no vm set up

## 2021-10-21 NOTE — Telephone Encounter (Signed)
Tried calling pt to let him know unable to get in touch.

## 2021-10-21 NOTE — Telephone Encounter (Signed)
Pt called stating his phar does not have his medication in stock. Wants to know if you can please resend to another phar that has it???   oxyCODONE-acetaminophen (PERCOCET) 10-325 MG tablet  [place]

## 2021-10-21 NOTE — Telephone Encounter (Signed)
Pt has been informed that Peter Congo stated last refill was the last she would do for him due to him having an appt scheduled on 08/14.

## 2021-10-21 NOTE — Telephone Encounter (Signed)
Pts daughter returned call states rx sent on Friday was not able to be filled at [facility] in [place] said its on back order, called [facility] spoke to [employer], confirmed it is on back order, pt asked for medication to be resent to [place] can you please  help with this?

## 2021-10-24 ENCOUNTER — Other Ambulatory Visit: Payer: Self-pay | Admitting: Family Medicine

## 2021-10-24 DIAGNOSIS — J449 Chronic obstructive pulmonary disease, unspecified: Secondary | ICD-10-CM

## 2021-11-05 NOTE — Telephone Encounter (Signed)
Yes

## 2021-11-16 NOTE — Progress Notes (Signed)
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         Sedimentation rate         25-Hydroxy vitamin D Lcms D2+D3         C-reactive protein     Imaging Orders         DG Lumbar Spine Complete W/Bend         DG HIP UNILAT W OR W/O PELVIS 2-3 VIEWS RIGHT         DG HIP UNILAT W OR W/O PELVIS 2-3 VIEWS LEFT     Referral Orders  No referral(s) requested today   Procedure Orders    No procedure(s) ordered today   Pharmacotherapy (current): Medications ordered:  No orders of the defined types were placed in this encounter.  Medications administered during this visit: Jerry Montz. Poyser had no medications administered during this visit.   Pharmacological management options:  Opioid Analgesics: The patient was informed that there is no guarantee that he would be a candidate for opioid analgesics. The decision will be made following CDC guidelines. This decision will be based on the results of diagnostic studies, as well  as Jerry Galloway risk  profile.   Membrane stabilizer: To be determined at a later time  Muscle relaxant: To be determined at a later time  NSAID: To be determined at a later time  Other analgesic(s): To be determined at a later time   Interventional management options: Jerry Galloway was informed that there is no guarantee that he would be a candidate for interventional therapies. The decision will be based on the results of diagnostic studies, as well as Jerry Galloway risk profile.  Procedure(s) under consideration:  Pending results of ordered studies      Interventional Therapies  Risk  Complexity Considerations:   Estimated body mass index is 21.37 kg/m as calculated from the following:   Height as of this encounter: '6\' 1"'  (1.854 m).   Weight as of this encounter: 162 lb (73.5 kg). WNL   Planned  Pending:   Pending further evaluation   Under consideration:   Diagnostic bilateral lumbar facet block #1  Diagnostic/therapeutic bilateral S1 transforaminal ESI #1  Diagnostic/therapeutic bilateral L5-S1 transforaminal ESI #1  Diagnostic/therapeutic midline caudal ESI + diagnostic epidurogram #1    Completed:   None at this time   Completed by other providers:   None at this time   Therapeutic  Palliative (PRN) options:   None established    Provider-requested follow-up: Return for (67mn), Eval-day (M,W), (F2F), 2nd Visit, for review of ordered tests.  Future Appointments  Date Time [place] 12/24/2021 10:00 AM NMilinda Pointer MD [facility] None  12/25/2021 10:00 AM [facility] 10/19/2022  4:00 PM [facility] NURSE [facility]    Note by: FGaspar Cola MD Date: 11/17/2021; Time: 11:50 AM

## 2021-11-17 ENCOUNTER — Ambulatory Visit: Payer: Medicare Other | Attending: Pain Medicine | Admitting: Pain Medicine

## 2021-11-17 ENCOUNTER — Encounter: Payer: Self-pay | Admitting: Pain Medicine

## 2021-11-17 VITALS — BP 155/79 | HR 82 | Temp 98.1°F | Resp 16 | Ht 73.0 in | Wt 162.0 lb

## 2021-11-17 DIAGNOSIS — M47816 Spondylosis without myelopathy or radiculopathy, lumbar region: Secondary | ICD-10-CM | POA: Diagnosis present

## 2021-11-17 DIAGNOSIS — M5136 Other intervertebral disc degeneration, lumbar region: Secondary | ICD-10-CM | POA: Insufficient documentation

## 2021-11-17 DIAGNOSIS — M4310 Spondylolisthesis, site unspecified: Secondary | ICD-10-CM

## 2021-11-17 DIAGNOSIS — Z789 Other specified health status: Secondary | ICD-10-CM | POA: Diagnosis present

## 2021-11-17 DIAGNOSIS — Z0289 Encounter for other administrative examinations: Secondary | ICD-10-CM | POA: Diagnosis present

## 2021-11-17 DIAGNOSIS — M79605 Pain in left leg: Secondary | ICD-10-CM | POA: Insufficient documentation

## 2021-11-17 DIAGNOSIS — N2889 Other specified disorders of kidney and ureter: Secondary | ICD-10-CM | POA: Insufficient documentation

## 2021-11-17 DIAGNOSIS — Z79899 Other long term (current) drug therapy: Secondary | ICD-10-CM | POA: Diagnosis present

## 2021-11-17 DIAGNOSIS — M51369 Other intervertebral disc degeneration, lumbar region without mention of lumbar back pain or lower extremity pain: Secondary | ICD-10-CM | POA: Insufficient documentation

## 2021-11-17 DIAGNOSIS — M25551 Pain in right hip: Secondary | ICD-10-CM

## 2021-11-17 DIAGNOSIS — G8929 Other chronic pain: Secondary | ICD-10-CM | POA: Diagnosis present

## 2021-11-17 DIAGNOSIS — M899 Disorder of bone, unspecified: Secondary | ICD-10-CM | POA: Diagnosis not present

## 2021-11-17 DIAGNOSIS — M47817 Spondylosis without myelopathy or radiculopathy, lumbosacral region: Secondary | ICD-10-CM | POA: Diagnosis present

## 2021-11-17 DIAGNOSIS — M25552 Pain in left hip: Secondary | ICD-10-CM

## 2021-11-17 DIAGNOSIS — I708 Atherosclerosis of other arteries: Secondary | ICD-10-CM | POA: Insufficient documentation

## 2021-11-17 DIAGNOSIS — R937 Abnormal findings on diagnostic imaging of other parts of musculoskeletal system: Secondary | ICD-10-CM | POA: Diagnosis present

## 2021-11-17 DIAGNOSIS — G894 Chronic pain syndrome: Secondary | ICD-10-CM | POA: Diagnosis present

## 2021-11-17 DIAGNOSIS — M79604 Pain in right leg: Secondary | ICD-10-CM | POA: Diagnosis present

## 2021-11-17 DIAGNOSIS — T50901S Poisoning by unspecified drugs, medicaments and biological substances, accidental (unintentional), sequela: Secondary | ICD-10-CM | POA: Diagnosis present

## 2021-11-17 DIAGNOSIS — M5125 Other intervertebral disc displacement, thoracolumbar region: Secondary | ICD-10-CM

## 2021-11-17 DIAGNOSIS — C4490 Unspecified malignant neoplasm of skin, unspecified: Secondary | ICD-10-CM | POA: Insufficient documentation

## 2021-11-17 DIAGNOSIS — M545 Low back pain, unspecified: Secondary | ICD-10-CM | POA: Diagnosis present

## 2021-11-17 NOTE — Patient Instructions (Signed)
____________________________________________________________________________________________  Drug Holidays (Slow)  What is a "Drug Holiday"? Drug Holiday: is the name given to the period of time during which a patient stops taking a medication(s) for the purpose of eliminating tolerance to the drug.  Benefits . Improved effectiveness of opioids. . Decreased opioid dose needed to achieve benefits. . Improved pain with lesser dose.  What is tolerance? Tolerance: is the progressive decreased in effectiveness of a drug due to its repetitive use. With repetitive use, the body gets use to the medication and as a consequence, it loses its effectiveness. This is a common problem seen with opioid pain medications. As a result, a larger dose of the drug is needed to achieve the same effect that used to be obtained with a smaller dose.  How long should a "Drug Holiday" last? You should stay off of the pain medicine for at least 14 consecutive days. (2 weeks)  Should I stop the medicine "cold turkey"? No. You should always coordinate with your Pain Specialist so that he/she can provide you with the correct medication dose to make the transition as smoothly as possible.  How do I stop the medicine? Slowly. You will be instructed to decrease the daily amount of pills that you take by one (1) pill every seven (7) days. This is called a "slow downward taper" of your dose. For example: if you normally take four (4) pills per day, you will be asked to drop this dose to three (3) pills per day for seven (7) days, then to two (2) pills per day for seven (7) days, then to one (1) per day for seven (7) days, and at the end of those last seven (7) days, this is when the "Drug Holiday" would start.   Will I have withdrawals? By doing a "slow downward taper" like this one, it is unlikely that you will experience any significant withdrawal symptoms. Typically, what triggers withdrawals is the sudden stop of a high  dose opioid therapy. Withdrawals can usually be avoided by slowly decreasing the dose over a prolonged period of time. If you do not follow these instructions and decide to stop your medication abruptly, withdrawals may be possible.  What are withdrawals? Withdrawals: refers to the wide range of symptoms that occur after stopping or dramatically reducing opiate drugs after heavy and prolonged use. Withdrawal symptoms do not occur to patients that use low dose opioids, or those who take the medication sporadically. Contrary to benzodiazepine (example: Valium, Xanax, etc.) or alcohol withdrawals ("Delirium Tremens"), opioid withdrawals are not lethal. Withdrawals are the physical manifestation of the body getting rid of the excess receptors.  Expected Symptoms Early symptoms of withdrawal may include: . Agitation . Anxiety . Muscle aches . Increased tearing . Insomnia . Runny nose . Sweating . Yawning  Late symptoms of withdrawal may include: . Abdominal cramping . Diarrhea . Dilated pupils . Goose bumps . Nausea . Vomiting  Will I experience withdrawals? Due to the slow nature of the taper, it is very unlikely that you will experience any.  What is a slow taper? Taper: refers to the gradual decrease in dose.  (Last update: 10/25/2019) ____________________________________________________________________________________________     

## 2021-11-17 NOTE — Progress Notes (Signed)
Safety precautions to be maintained throughout the outpatient stay will include: orient to surroundings, keep bed in low position, maintain call bell within reach at all times, provide assistance with transfer out of bed and ambulation.  

## 2021-11-18 LAB — COMP. METABOLIC PANEL (12)
AST: 13 IU/L (ref 0–40)
Albumin/Globulin Ratio: 1.6 (ref 1.2–2.2)
Albumin: 5 g/dL — ABNORMAL HIGH (ref 3.8–4.8)
Alkaline Phosphatase: 88 IU/L (ref 44–121)
BUN/Creatinine Ratio: 13 (ref 10–24)
BUN: 27 mg/dL (ref 8–27)
Bilirubin Total: 0.4 mg/dL (ref 0.0–1.2)
Calcium: 9.8 mg/dL (ref 8.6–10.2)
Chloride: 101 mmol/L (ref 96–106)
Creatinine, Ser: 2.04 mg/dL — ABNORMAL HIGH (ref 0.76–1.27)
Globulin, Total: 3.1 g/dL (ref 1.5–4.5)
Glucose: 119 mg/dL — ABNORMAL HIGH (ref 70–99)
Potassium: 5 mmol/L (ref 3.5–5.2)
Sodium: 140 mmol/L (ref 134–144)
Total Protein: 8.1 g/dL (ref 6.0–8.5)
eGFR: 34 mL/min/{1.73_m2} — ABNORMAL LOW (ref >59)

## 2021-11-19 ENCOUNTER — Telehealth: Payer: Self-pay | Admitting: Family Medicine

## 2021-11-19 NOTE — Telephone Encounter (Signed)
Called in on patient behalf in regard to pain clinic. Clinic will not be able to adjust patient meds for about 2 weeks.  Patient is requesting refill for 2 weeks until pain clinic can fully take over   Wants a call back    oxyCODONE-acetaminophen (PERCOCET) 10-325 MG tablet

## 2021-11-20 LAB — COMPLIANCE DRUG ANALYSIS, UR

## 2021-11-21 LAB — 25-HYDROXY VITAMIN D LCMS D2+D3
25-Hydroxy, Vitamin D-2: 4.5 ng/mL
25-Hydroxy, Vitamin D-3: 47 ng/mL
25-Hydroxy, Vitamin D: 52 ng/mL

## 2021-11-21 LAB — SEDIMENTATION RATE: Sed Rate: 59 mm/hr — ABNORMAL HIGH (ref 0–30)

## 2021-11-21 LAB — C-REACTIVE PROTEIN: CRP: 4 mg/L (ref 0–10)

## 2021-11-21 LAB — VITAMIN B12: Vitamin B-12: 399 pg/mL (ref 232–1245)

## 2021-11-21 LAB — MAGNESIUM: Magnesium: 2.3 mg/dL (ref 1.6–2.3)

## 2021-11-24 ENCOUNTER — Other Ambulatory Visit: Payer: Self-pay | Admitting: Family Medicine

## 2021-11-24 ENCOUNTER — Telehealth: Payer: Self-pay

## 2021-11-24 DIAGNOSIS — G8929 Other chronic pain: Secondary | ICD-10-CM

## 2021-11-24 MED ORDER — OXYCODONE-ACETAMINOPHEN 10-325 MG PO TABS: 1.0000 | ORAL_TABLET | ORAL | 0 refills | Status: AC | PRN

## 2021-11-24 NOTE — Telephone Encounter (Signed)
Patient called meds need to be sent to pharmacy in [place] other pharmacy out of stock.   oxyCODONE-acetaminophen (PERCOCET) 10-325 MG   Pharmacy: [facility]

## 2021-11-24 NOTE — Telephone Encounter (Signed)
Refills sent

## 2021-11-24 NOTE — Telephone Encounter (Signed)
Spoke to [facility] pharmacist confirmed pt picked up rx today that was sent in on 11/24/21.

## 2021-11-25 NOTE — Telephone Encounter (Signed)
Thank you :)

## 2021-12-01 ENCOUNTER — Telehealth: Payer: Self-pay | Admitting: Family Medicine

## 2021-12-01 NOTE — Telephone Encounter (Signed)
[facility] is currently out of med   oxyCODONE-acetaminophen (PERCOCET) 10-325 MG tablet    Patient wants sent to [facility] [address]

## 2021-12-02 NOTE — Telephone Encounter (Signed)
Pt states he went in to see the pain clinic in [place] and they only gave him a cortisone shot, he will go to [place] on 12/25/21, asking for another script of oxycodone please advice?

## 2021-12-02 NOTE — Telephone Encounter (Signed)
Please advise patient to follow-up with the pain clinic

## 2021-12-02 NOTE — Telephone Encounter (Signed)
Spoke to pt and explained he will have to follow up with pain management.
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
## 2021-12-24 ENCOUNTER — Ambulatory Visit (HOSPITAL_BASED_OUTPATIENT_CLINIC_OR_DEPARTMENT_OTHER): Payer: Medicare Other | Admitting: Pain Medicine

## 2021-12-24 DIAGNOSIS — M5136 Other intervertebral disc degeneration, lumbar region: Secondary | ICD-10-CM

## 2021-12-24 DIAGNOSIS — Z91199 Patient's noncompliance with other medical treatment and regimen due to unspecified reason: Secondary | ICD-10-CM

## 2021-12-24 DIAGNOSIS — Z79899 Other long term (current) drug therapy: Secondary | ICD-10-CM | POA: Insufficient documentation

## 2021-12-24 DIAGNOSIS — M5126 Other intervertebral disc displacement, lumbar region: Secondary | ICD-10-CM | POA: Insufficient documentation

## 2021-12-24 DIAGNOSIS — G8929 Other chronic pain: Secondary | ICD-10-CM

## 2021-12-24 DIAGNOSIS — Z9189 Other specified personal risk factors, not elsewhere classified: Secondary | ICD-10-CM | POA: Insufficient documentation

## 2021-12-24 DIAGNOSIS — M4807 Spinal stenosis, lumbosacral region: Secondary | ICD-10-CM | POA: Insufficient documentation

## 2021-12-24 DIAGNOSIS — M47817 Spondylosis without myelopathy or radiculopathy, lumbosacral region: Secondary | ICD-10-CM

## 2021-12-24 DIAGNOSIS — G894 Chronic pain syndrome: Secondary | ICD-10-CM

## 2021-12-24 DIAGNOSIS — M4310 Spondylolisthesis, site unspecified: Secondary | ICD-10-CM

## 2021-12-24 DIAGNOSIS — Z79891 Long term (current) use of opiate analgesic: Secondary | ICD-10-CM | POA: Insufficient documentation

## 2021-12-24 DIAGNOSIS — M48061 Spinal stenosis, lumbar region without neurogenic claudication: Secondary | ICD-10-CM | POA: Insufficient documentation

## 2021-12-24 DIAGNOSIS — M47816 Spondylosis without myelopathy or radiculopathy, lumbar region: Secondary | ICD-10-CM

## 2021-12-25 ENCOUNTER — Ambulatory Visit: Payer: Medicare Other | Admitting: Family Medicine

## 2021-12-25 ENCOUNTER — Encounter: Payer: Self-pay | Admitting: Family Medicine

## 2022-02-01 ENCOUNTER — Other Ambulatory Visit: Payer: Self-pay | Admitting: Family Medicine

## 2022-02-01 DIAGNOSIS — I10 Essential (primary) hypertension: Secondary | ICD-10-CM

## 2022-03-13 ENCOUNTER — Encounter: Payer: Self-pay | Admitting: Family Medicine

## 2022-03-13 ENCOUNTER — Ambulatory Visit (INDEPENDENT_AMBULATORY_CARE_PROVIDER_SITE_OTHER): Payer: Medicare Other | Admitting: Family Medicine

## 2022-03-13 VITALS — BP 138/73 | HR 83 | Ht 72.0 in | Wt 167.1 lb

## 2022-03-13 DIAGNOSIS — G2581 Restless legs syndrome: Secondary | ICD-10-CM | POA: Diagnosis not present

## 2022-03-13 DIAGNOSIS — Z23 Encounter for immunization: Secondary | ICD-10-CM | POA: Diagnosis not present

## 2022-03-13 DIAGNOSIS — Z720 Tobacco use: Secondary | ICD-10-CM | POA: Diagnosis not present

## 2022-03-13 DIAGNOSIS — M792 Neuralgia and neuritis, unspecified: Secondary | ICD-10-CM

## 2022-03-13 DIAGNOSIS — E785 Hyperlipidemia, unspecified: Secondary | ICD-10-CM

## 2022-03-13 DIAGNOSIS — J449 Chronic obstructive pulmonary disease, unspecified: Secondary | ICD-10-CM | POA: Diagnosis not present

## 2022-03-13 DIAGNOSIS — R7301 Impaired fasting glucose: Secondary | ICD-10-CM

## 2022-03-13 DIAGNOSIS — E559 Vitamin D deficiency, unspecified: Secondary | ICD-10-CM

## 2022-03-13 DIAGNOSIS — I10 Essential (primary) hypertension: Secondary | ICD-10-CM | POA: Diagnosis not present

## 2022-03-13 DIAGNOSIS — E038 Other specified hypothyroidism: Secondary | ICD-10-CM

## 2022-03-13 DIAGNOSIS — Z1159 Encounter for screening for other viral diseases: Secondary | ICD-10-CM

## 2022-03-13 DIAGNOSIS — E782 Mixed hyperlipidemia: Secondary | ICD-10-CM

## 2022-03-13 DIAGNOSIS — Z114 Encounter for screening for human immunodeficiency virus [HIV]: Secondary | ICD-10-CM

## 2022-03-13 MED ORDER — AMLODIPINE BESYLATE 5 MG PO TABS: 5.0000 mg | ORAL_TABLET | Freq: Every day | ORAL | 1 refills | Status: DC

## 2022-03-13 MED ORDER — ROSUVASTATIN CALCIUM 40 MG PO TABS: 40.0000 mg | ORAL_TABLET | Freq: Every day | ORAL | 1 refills | Status: DC

## 2022-03-13 MED ORDER — BREZTRI AEROSPHERE 160-9-4.8 MCG/ACT IN AERO: 2.0000 | INHALATION_SPRAY | Freq: Two times a day (BID) | RESPIRATORY_TRACT | 11 refills | Status: DC

## 2022-03-13 MED ORDER — ALBUTEROL SULFATE HFA 108 (90 BASE) MCG/ACT IN AERS: INHALATION_SPRAY | RESPIRATORY_TRACT | 1 refills | Status: DC

## 2022-03-13 MED ORDER — GABAPENTIN 100 MG PO CAPS: 100.0000 mg | ORAL_CAPSULE | Freq: Every evening | ORAL | 0 refills | Status: DC | PRN

## 2022-03-13 MED ORDER — NICOTINE 21 MG/24HR TD PT24: 21.0000 mg | MEDICATED_PATCH | Freq: Every day | TRANSDERMAL | 0 refills | Status: AC

## 2022-03-13 NOTE — Progress Notes (Unsigned)
Established Patient Office Visit  Subjective:  Patient ID: JB DULWORTH, male    DOB: April 05, 1951  Age: 71 y.o. MRN: [id number]  CC:  Chief Complaint  Patient presents with   Follow-up    Following up, needs refill on bp medication and inhaler, and would like a rx for his nerves. Needs patches to help with smoking cessation    HPI Jerry Galloway is a 71 y.o. male with past medical history of hypertension, tobacco use, and hyperlipidemia presents for f/u of  chronic medical conditions.  Hypertension:He takes amlodipine 5 mg daily. He denies chest pain, palpitation, shortness of breath, headaches, and dizziness. He reports compliance with treatment regimen.   Tobacco use: He reports smoking 1 pack of cigarettes daily and will like to quit smoking.  The patient is requesting nicotine patches to help with smoking cessation.  COPD: He reports using his rescue inhaler daily and complains of shortness of breath with exertion.  Restless leg syndrome: He complains of uncontrollable movements of his legs occasionally at nighttime.  Past Medical History:  Diagnosis Date   Chronic pain    CKD (chronic kidney disease)    HTN (hypertension)     History reviewed. No pertinent surgical history.  History reviewed. No pertinent family history.  Social History   Socioeconomic History   Marital status: Married    Spouse name: Not on file   Number of children: Not on file   Years of education: Not on file   Highest education level: Not on file  Occupational History   Not on file  Tobacco Use   Smoking status: Every Day    Packs/day: 1.00    Years: 60.00    Total pack years: 60.00    Types: Cigarettes   Smokeless tobacco: Never  Substance and Sexual Activity   Alcohol use: Never   Drug use: Never   Sexual activity: Not on file  Other Topics Concern   Not on file  Social History Narrative   Not on file   Social Determinants of Health   Financial Resource Strain:  Low Risk  (10/17/2021)   Overall Financial Resource Strain (CARDIA)    Difficulty of Paying Living Expenses: Not hard at all  Food Insecurity: No Food Insecurity (10/17/2021)   Hunger Vital Sign    Worried About Running Out of Food in the Last Year: Never true    [place] in the Last Year: Never true  Transportation Needs: No Transportation Needs (10/17/2021)   PRAPARE - Hydrologist (Medical): No    Lack of Transportation (Non-Medical): No  Physical Activity: Sufficiently Active (10/17/2021)   Exercise Vital Sign    Days of Exercise per Week: 7 days    Minutes of Exercise per Session: 30 min  Stress: No Stress Concern Present (10/17/2021)   [place]    Feeling of Stress : Not at all  Social Connections: Moderately Integrated (10/17/2021)   Social Connection and Isolation Panel [NHANES]    Frequency of Communication with Friends and Family: More than three times a week    Frequency of Social Gatherings with Friends and Family: More than three times a week    Attends Religious Services: More than 4 times per year    Active Member of [employer] or Organizations: No    Attends Archivist Meetings: Never    Marital Status: Married  Human resources officer Violence: Not on  file    Outpatient Medications Prior to Visit  Medication Sig Dispense Refill   cholecalciferol (VITAMIN D3) 25 MCG (1000 UNIT) tablet Take 1,000 Units by mouth daily.     clonazePAM (KLONOPIN) 0.5 MG tablet Take 1 tablet by mouth 2 (two) times daily as needed.     oxyCODONE-acetaminophen (PERCOCET) 10-325 MG tablet Take 1 tablet by mouth every 4 (four) hours as needed for pain (takes five a day). 30 tablet 0   albuterol (VENTOLIN HFA) 108 (90 Base) MCG/ACT inhaler TAKE 2 PUFFS BY MOUTH EVERY 6 HOURS AS NEEDED FOR WHEEZE OR SHORTNESS OF BREATH 6.7 each 1   amLODipine (NORVASC) 2.5 MG tablet Take 2.5 mg by mouth 2 (two) times  daily.     rosuvastatin (CRESTOR) 40 MG tablet Take 1 tablet (40 mg total) by mouth daily. 90 tablet 1   amLODipine (NORVASC) 5 MG tablet TAKE 1 TABLET (5 MG TOTAL) BY MOUTH DAILY. 90 tablet 1   No facility-administered medications prior to visit.    No Known Allergies  ROS Review of Systems  Constitutional:  Negative for fatigue and fever.  Eyes:  Negative for visual disturbance.  Respiratory:  Positive for shortness of breath (with exertion). Negative for cough and wheezing.   Endocrine: Negative for polydipsia, polyphagia and polyuria.  Musculoskeletal:  Negative for back pain.  Neurological:  Negative for dizziness.      Objective:    Physical Exam HENT:     Head: Normocephalic.     Right Ear: External ear normal.     Left Ear: External ear normal.     Mouth/Throat:     Mouth: Mucous membranes are moist.  Cardiovascular:     Rate and Rhythm: Normal rate and regular rhythm.     Heart sounds: No murmur heard. Pulmonary:     Effort: No respiratory distress.     Breath sounds: Normal breath sounds. No wheezing.  Neurological:     Mental Status: He is alert.     BP 138/73   Pulse 83   Ht 6' (1.829 m)   Wt 167 lb 1.9 oz (75.8 kg)   SpO2 96%   BMI 22.67 kg/m  Wt Readings from Last 3 Encounters:  03/13/22 167 lb 1.9 oz (75.8 kg)  11/17/21 162 lb (73.5 kg)  09/24/21 160 lb 6.4 oz (72.8 kg)    Lab Results  Component Value Date   TSH 1.360 09/29/2021   Lab Results  Component Value Date   WBC 7.0 09/29/2021   HGB 14.6 09/29/2021   HCT 43.5 09/29/2021   MCV 94 09/29/2021   PLT 262 09/29/2021   Lab Results  Component Value Date   NA 140 11/17/2021   K 5.0 11/17/2021   CO2 19 (L) 09/29/2021   GLUCOSE 119 (H) 11/17/2021   BUN 27 11/17/2021   CREATININE 2.04 (H) 11/17/2021   BILITOT 0.4 11/17/2021   ALKPHOS 88 11/17/2021   AST 13 11/17/2021   ALT 9 09/29/2021   PROT 8.1 11/17/2021   ALBUMIN 5.0 (H) 11/17/2021   CALCIUM 9.8 11/17/2021   ANIONGAP 13  02/21/2020   EGFR 34 (L) 11/17/2021   Lab Results  Component Value Date   CHOL 187 09/29/2021   Lab Results  Component Value Date   HDL 32 (L) 09/29/2021   Lab Results  Component Value Date   LDLCALC 124 (H) 09/29/2021   Lab Results  Component Value Date   TRIG 170 (H) 09/29/2021   Lab Results  Component  Value Date   CHOLHDL 5.8 (H) 09/29/2021   Lab Results  Component Value Date   HGBA1C 5.7 (H) 09/29/2021      Assessment & Plan:  Essential hypertension Assessment & Plan: Controlled Encouraged to continue taking amlodipine 5 mg daily Encouraged low-sodium diet with increased physical activities Refill sent to pharmacy BP Readings from Last 3 Encounters:  03/13/22 138/73  11/17/21 (!) 155/79  09/24/21 126/76     Orders: -     amLODIPine Besylate; Take 1 tablet (5 mg total) by mouth daily.  Dispense: 90 tablet; Refill: 1 -     CMP14+EGFR -     CBC with Differential/Platelet  Restless leg syndrome Assessment & Plan: Will start the patient on gabapentin 100 mg nightly as needed for symptoms of restless leg syndrome.  Orders: -     Gabapentin; Take 1 capsule (100 mg total) by mouth at bedtime as needed.  Dispense: 30 capsule; Refill: 0  Chronic obstructive pulmonary disease, unspecified COPD type ([place]) Assessment & Plan: Patient reports frequent use of his rescue inhaler and requests a refill Will start patient on maintenance treatment with Breztri daily inhaler To prevent dry mouth, hoarseness, and oral yeast infections from developing, gargle, rinse your mouth with water and spit out after each use.  Do not swallow the rice water A sample of Judithann Sauger is provided to the patient in the clinic, and the prescription is sent to the pharmacy A refill of his albuterol inhaler is sent to the pharmacy  Orders: -     Albuterol Sulfate HFA; TAKE 2 PUFFS BY MOUTH EVERY 6 HOURS AS NEEDED FOR WHEEZE OR SHORTNESS OF BREATH  Dispense: 6.7 each; Refill: 1 -     Breztri  Aerosphere; Inhale 2 puffs into the lungs 2 (two) times daily.  Dispense: 10.7 g; Refill: 11  Tobacco use Assessment & Plan: The patient reports a desire to quit smoking We will start the patient on NicoDerm patch 21 mg patches for 6 weeks Encouraged the patient to call for refill of his nicotine patches   Orders: -     Nicotine; Place 1 patch (21 mg total) onto the skin daily.  Dispense: 42 patch; Refill: 0 -     Lipid panel  Dyslipidemia Assessment & Plan: He takes rosuvastatin 40 mg daily He denies muscle aches and pain Refill sent to pharmacy  Orders: -     Rosuvastatin Calcium; Take 1 tablet (40 mg total) by mouth daily.  Dispense: 90 tablet; Refill: 1 -     Lipid panel  Need for immunization against influenza  Flu vaccine need -     Flu Vaccine QUAD High Dose(Fluad)  Immunization due -     Pneumococcal conjugate vaccine 20-valent  Need for hepatitis C screening test -     Hepatitis C antibody  Encounter for screening for HIV -     HIV Antibody (routine testing w rflx)  Vitamin D deficiency -     VITAMIN D 25 Hydroxy (Vit-D Deficiency, Fractures)  IFG (impaired fasting glucose) -     Hemoglobin A1c  Other specified hypothyroidism -     TSH + free T4    Follow-up: Return in about 3 months (around 06/12/2022).   Alvira Monday, FNP

## 2022-03-13 NOTE — Patient Instructions (Addendum)
I appreciate the opportunity to provide care to you today!    Follow up:  3 months  Labs: please stop by the lab during the week to get your blood drawn (CBC, CMP, TSH, Lipid profile, HgA1c, Vit D)  Please pick up your medications at the pharmacy  NicoDerm patch has been sent to your pharmacy to use for smoking cessation. The prescription is for 6 weeks duration, please call for refill  A refill of your blood pressure medication and your cholesterol medication has also been sent to your pharmacy  I have sent you a prescription of gabapentin 100 mg to take at bedtime as needed for restless leg syndrome  A refill of the albuterol inhaler has been sent to your pharmacy  Please start using your Breztri inhaler daily for your COPD To prevent dry mouth, hoarseness, and oral yeast infections from developing, gargle, rinse your mouth with water and spit out after each use.  Do not swallow the rice water.  Happy Holiday and thank you for getting your vaccinations today  Please continue to a heart-healthy diet and increase your physical activities. Try to exercise for 47mns at least three times a week.      It was a pleasure to see you and I look forward to continuing to work together on your health and well-being. Please do not hesitate to call the office if you need care or have questions about your care.   Have a wonderful day and week. With Gratitude, GAlvira MondayMSN, FNP-BC

## 2022-03-14 DIAGNOSIS — Z23 Encounter for immunization: Secondary | ICD-10-CM | POA: Insufficient documentation

## 2022-03-14 DIAGNOSIS — G2581 Restless legs syndrome: Secondary | ICD-10-CM | POA: Insufficient documentation

## 2022-03-14 NOTE — Assessment & Plan Note (Signed)
The patient reports a desire to quit smoking We will start the patient on NicoDerm patch 21 mg patches for 6 weeks Encouraged the patient to call for refill of his nicotine patches

## 2022-03-14 NOTE — Assessment & Plan Note (Signed)
Controlled Encouraged to continue taking amlodipine 5 mg daily Encouraged low-sodium diet with increased physical activities Refill sent to pharmacy BP Readings from Last 3 Encounters:  03/13/22 138/73  11/17/21 (!) 155/79  09/24/21 126/76

## 2022-03-14 NOTE — Assessment & Plan Note (Signed)
Will start the patient on gabapentin 100 mg nightly as needed for symptoms of restless leg syndrome.

## 2022-03-14 NOTE — Assessment & Plan Note (Signed)
Patient reports frequent use of his rescue inhaler and requests a refill Will start patient on maintenance treatment with Breztri daily inhaler To prevent dry mouth, hoarseness, and oral yeast infections from developing, gargle, rinse your mouth with water and spit out after each use.  Do not swallow the rice water A sample of Jerry Galloway is provided to the patient in the clinic, and the prescription is sent to the pharmacy A refill of his albuterol inhaler is sent to the pharmacy

## 2022-03-14 NOTE — Assessment & Plan Note (Signed)
He takes rosuvastatin 40 mg daily He denies muscle aches and pain Refill sent to pharmacy

## 2022-03-19 ENCOUNTER — Other Ambulatory Visit: Payer: Self-pay | Admitting: Family Medicine

## 2022-03-19 ENCOUNTER — Telehealth: Payer: Self-pay | Admitting: Family Medicine

## 2022-03-19 NOTE — Telephone Encounter (Signed)
Spoke to pharmacy, said nicotine patches are not covered by insurance he will have to buy otc. Pleae advice on the Klonopin?

## 2022-03-19 NOTE — Telephone Encounter (Signed)
The patient has not been on clonazepam since 2015.kindly ask the patient to schedule a televisit for medication refill.

## 2022-03-19 NOTE — Telephone Encounter (Signed)
Pt scheduled on 03/20/2022 11:40am telephone visit.

## 2022-03-19 NOTE — Telephone Encounter (Signed)
Sates that pharm did not receive prescription for  nicotine (NICODERM CQ - DOSED IN MG/24 HOURS) 21 mg/24hr patch   Patient is requesting refill on  clonazePAM (KLONOPIN) 0.5 MG tablet    [facility] #[id number] [address], [place] AT [address] [place], [place] [address] Phone: [phone]  Fax: [phone] DEA #: [id number] [place] Reason: --

## 2022-03-19 NOTE — Telephone Encounter (Signed)
Thank you :)

## 2022-03-20 ENCOUNTER — Encounter: Payer: Self-pay | Admitting: Family Medicine

## 2022-03-20 ENCOUNTER — Telehealth: Payer: Medicare Other | Admitting: Family Medicine

## 2022-04-22 ENCOUNTER — Other Ambulatory Visit: Payer: Self-pay | Admitting: Family Medicine

## 2022-04-22 DIAGNOSIS — G2581 Restless legs syndrome: Secondary | ICD-10-CM

## 2022-05-15 ENCOUNTER — Other Ambulatory Visit: Payer: Self-pay | Admitting: Family Medicine

## 2022-05-15 DIAGNOSIS — J449 Chronic obstructive pulmonary disease, unspecified: Secondary | ICD-10-CM

## 2022-06-12 ENCOUNTER — Ambulatory Visit: Payer: Medicare Other | Admitting: Family Medicine

## 2022-06-15 ENCOUNTER — Encounter: Payer: Self-pay | Admitting: Family Medicine

## 2022-07-30 IMAGING — CT CT HEAD W/O CM
4 series · 16 of 47 positions shown, 18 images · non-contrast
Comparison: Head CT dated 12/12/2008.

CLINICAL DATA: 69-year-old male with delirium.

EXAM:
CT HEAD WITHOUT CONTRAST
TECHNIQUE: Contiguous axial images were obtained from the base of the skull
through the vertex without intravenous contrast.

[Series 2: head wo · axial · 0.43mm/px · z∈[-142,-22]mm · 7 of 34 slices shown, 9 images]
[im 5/34  brain]
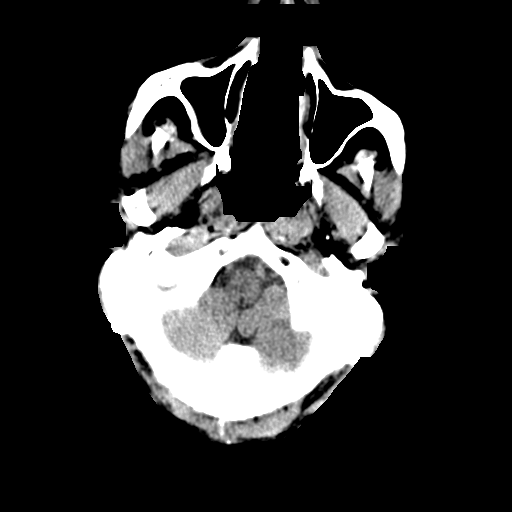
[im 5/34  bone]
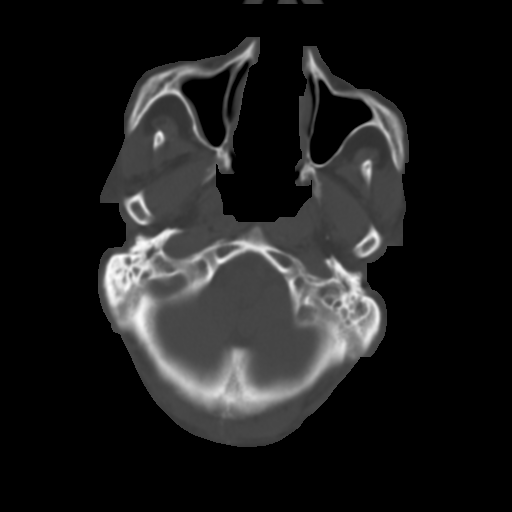
[im 9/34  brain]
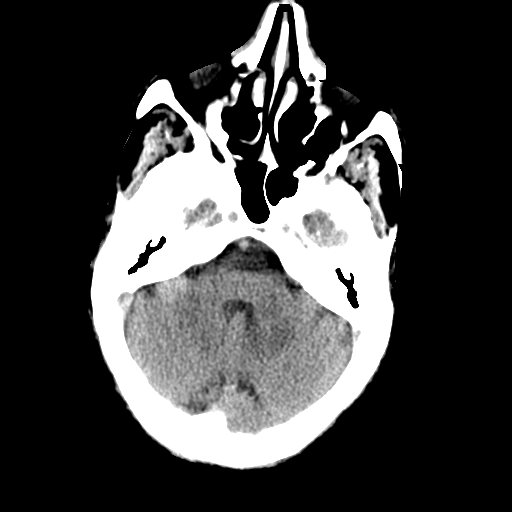
[im 13/34  brain]
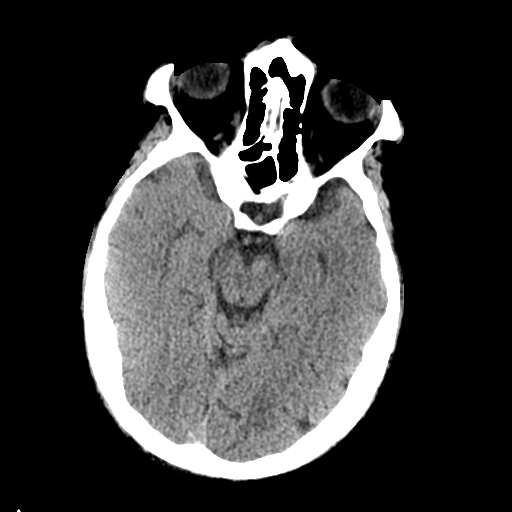
[im 17/34  brain]
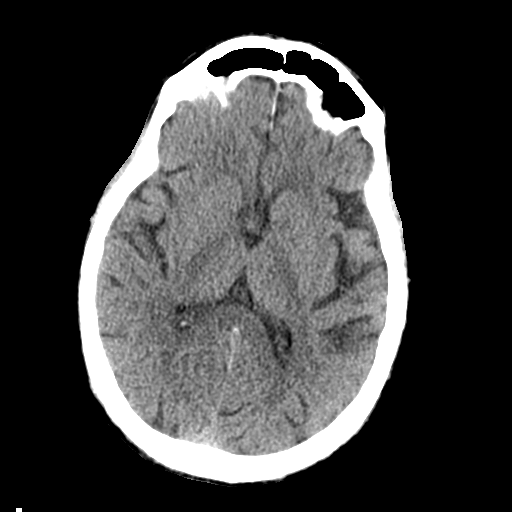
[im 21/34  brain]
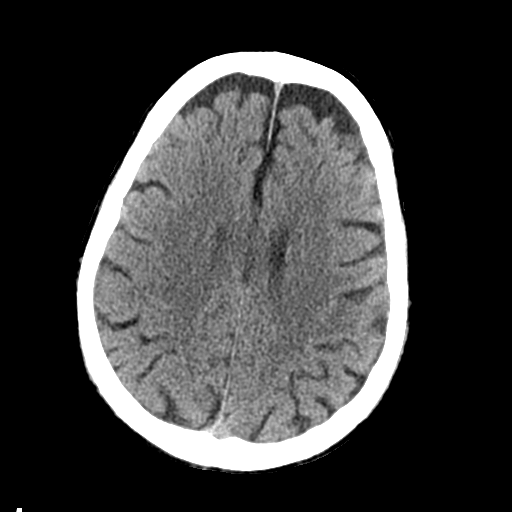
[im 21/34  bone]
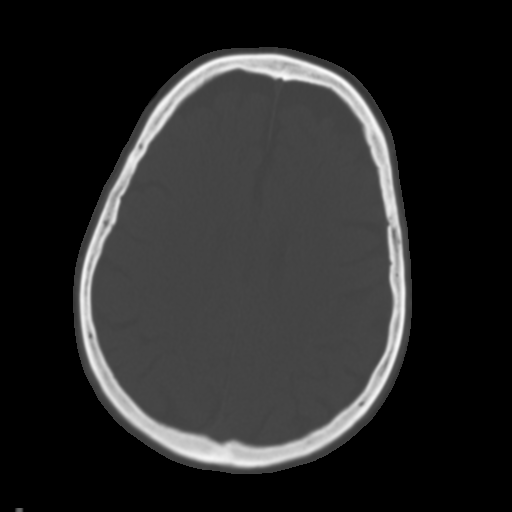
[im 25/34  brain]
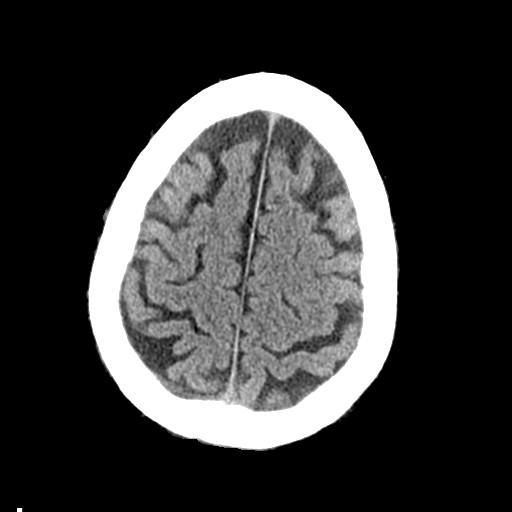
[im 29/34  brain]
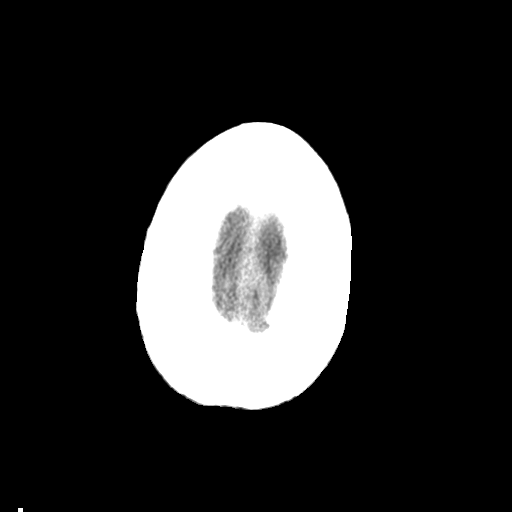

[Series 3: head bone · axial · 0.43mm/px · z∈[-146,-112]mm · 3 of 85 slices shown]
[im 9/85  bone]
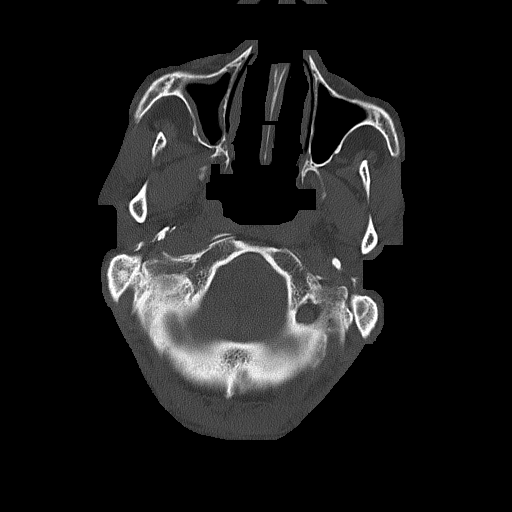
[im 17/85  bone]
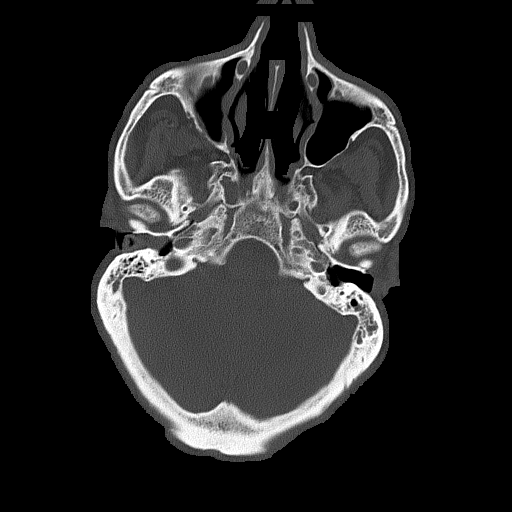
[im 26/85  bone]
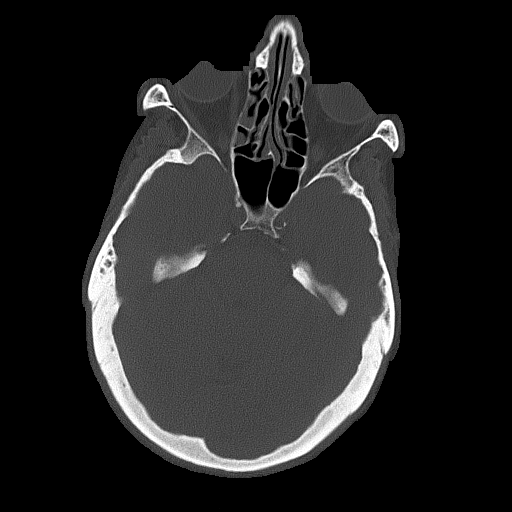

[Series 4: coronal soft tissue · coronal · 0.32mm/px · 3 of 73 slices shown]
[im 25/73  brain]
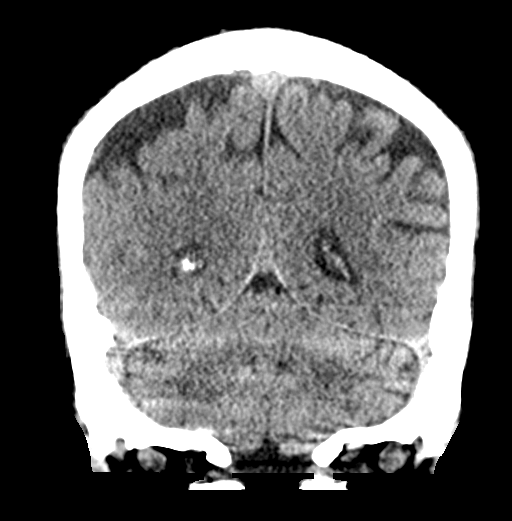
[im 33/73  brain]
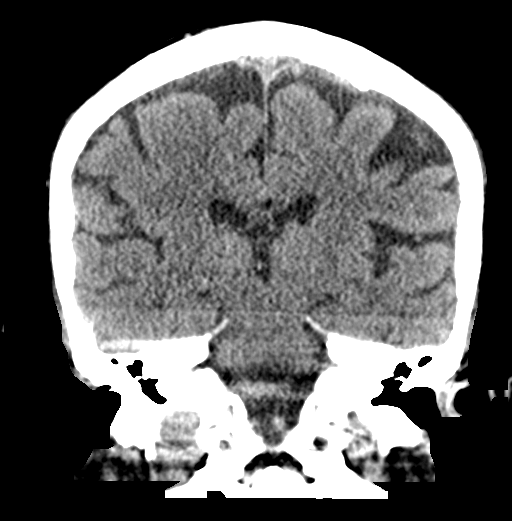
[im 41/73  brain]
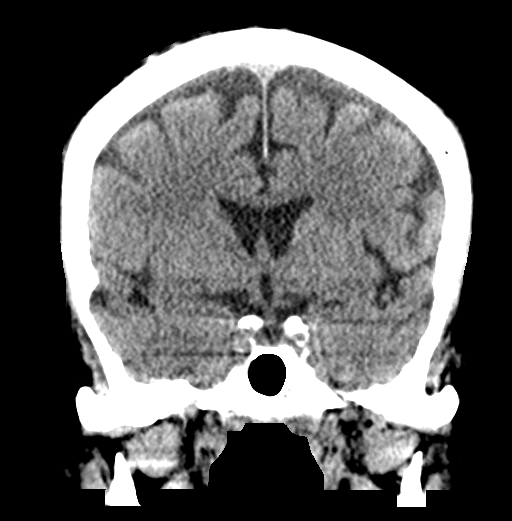

[Series 5: sagittal soft tissue · sagittal · 0.33mm/px · 3 of 54 slices shown]
[im 18/54  brain]
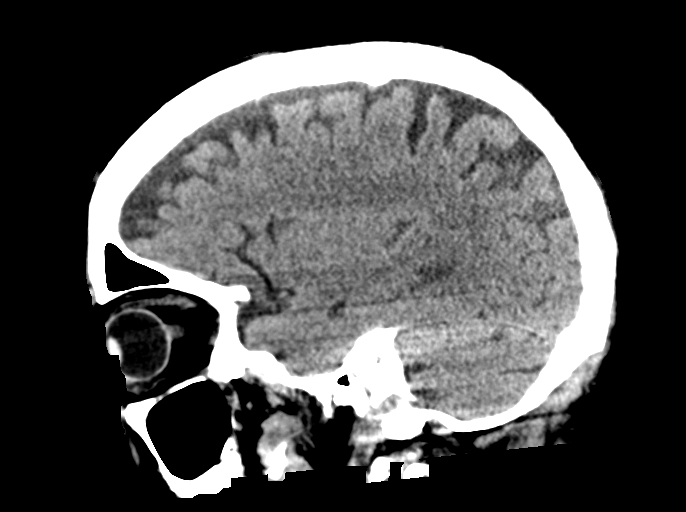
[im 27/54  brain]
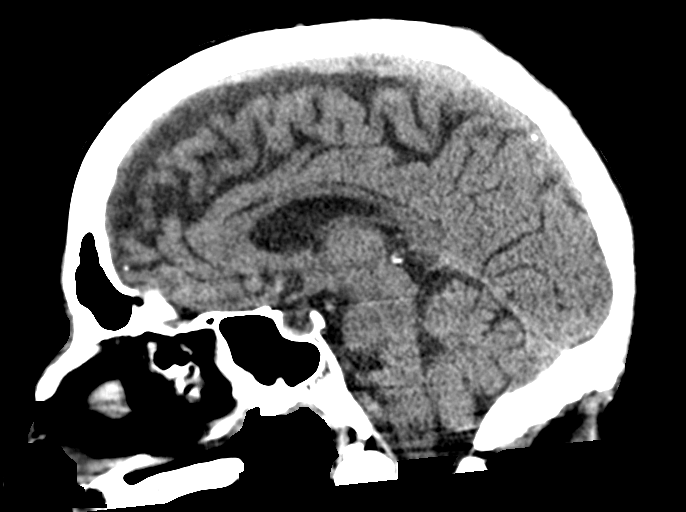
[im 36/54  brain]
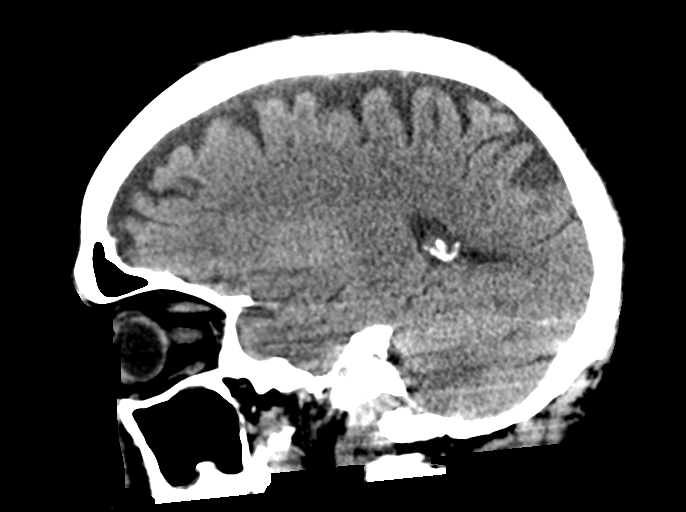

[16 of 47 positions shown; findings below may reference images not displayed]

FINDINGS: Brain: The ventricles and sulci appropriate size for patient's age.
The gray-white matter discrimination is preserved. There is no acute
intracranial hemorrhage. No mass effect or midline shift. No
extra-axial fluid collection.

Vascular: No hyperdense vessel or unexpected calcification.

Skull: Normal. Negative for fracture or focal lesion.

Sinuses/Orbits: Mild mucoperiosteal thickening of paranasal sinuses.
No air-fluid level. Bilateral mastoid effusions.

Other: None
IMPRESSION: 1. No acute intracranial pathology.
2. Bilateral mastoid effusions.

## 2022-09-21 ENCOUNTER — Other Ambulatory Visit: Payer: Self-pay | Admitting: Family Medicine

## 2022-09-21 DIAGNOSIS — I10 Essential (primary) hypertension: Secondary | ICD-10-CM

## 2022-11-12 DIAGNOSIS — Z79891 Long term (current) use of opiate analgesic: Secondary | ICD-10-CM | POA: Diagnosis not present

## 2022-11-12 DIAGNOSIS — M2550 Pain in unspecified joint: Secondary | ICD-10-CM | POA: Diagnosis not present

## 2022-11-12 DIAGNOSIS — Z79899 Other long term (current) drug therapy: Secondary | ICD-10-CM | POA: Diagnosis not present

## 2022-11-12 DIAGNOSIS — M549 Dorsalgia, unspecified: Secondary | ICD-10-CM | POA: Diagnosis not present

## 2022-11-12 DIAGNOSIS — M542 Cervicalgia: Secondary | ICD-10-CM | POA: Diagnosis not present

## 2022-11-12 DIAGNOSIS — G894 Chronic pain syndrome: Secondary | ICD-10-CM | POA: Diagnosis not present

## 2023-01-09 ENCOUNTER — Other Ambulatory Visit: Payer: Self-pay | Admitting: Family Medicine

## 2023-01-09 DIAGNOSIS — E785 Hyperlipidemia, unspecified: Secondary | ICD-10-CM

## 2023-01-18 DIAGNOSIS — M2550 Pain in unspecified joint: Secondary | ICD-10-CM | POA: Diagnosis not present

## 2023-01-18 DIAGNOSIS — G894 Chronic pain syndrome: Secondary | ICD-10-CM | POA: Diagnosis not present

## 2023-01-18 DIAGNOSIS — Z79899 Other long term (current) drug therapy: Secondary | ICD-10-CM | POA: Diagnosis not present

## 2023-01-18 DIAGNOSIS — M549 Dorsalgia, unspecified: Secondary | ICD-10-CM | POA: Diagnosis not present

## 2023-01-18 DIAGNOSIS — M542 Cervicalgia: Secondary | ICD-10-CM | POA: Diagnosis not present

## 2023-01-18 DIAGNOSIS — Z79891 Long term (current) use of opiate analgesic: Secondary | ICD-10-CM | POA: Diagnosis not present

## 2023-03-10 DIAGNOSIS — G894 Chronic pain syndrome: Secondary | ICD-10-CM | POA: Diagnosis not present

## 2023-03-10 DIAGNOSIS — M542 Cervicalgia: Secondary | ICD-10-CM | POA: Diagnosis not present

## 2023-03-10 DIAGNOSIS — M549 Dorsalgia, unspecified: Secondary | ICD-10-CM | POA: Diagnosis not present

## 2023-03-10 DIAGNOSIS — Z79899 Other long term (current) drug therapy: Secondary | ICD-10-CM | POA: Diagnosis not present

## 2023-03-10 DIAGNOSIS — M2550 Pain in unspecified joint: Secondary | ICD-10-CM | POA: Diagnosis not present

## 2023-04-22 DIAGNOSIS — M2550 Pain in unspecified joint: Secondary | ICD-10-CM | POA: Diagnosis not present

## 2023-04-22 DIAGNOSIS — Z79891 Long term (current) use of opiate analgesic: Secondary | ICD-10-CM | POA: Diagnosis not present

## 2023-04-22 DIAGNOSIS — Z79899 Other long term (current) drug therapy: Secondary | ICD-10-CM | POA: Diagnosis not present

## 2023-04-22 DIAGNOSIS — G894 Chronic pain syndrome: Secondary | ICD-10-CM | POA: Diagnosis not present

## 2023-04-22 DIAGNOSIS — M542 Cervicalgia: Secondary | ICD-10-CM | POA: Diagnosis not present

## 2023-04-22 DIAGNOSIS — M549 Dorsalgia, unspecified: Secondary | ICD-10-CM | POA: Diagnosis not present

## 2023-05-20 DIAGNOSIS — G894 Chronic pain syndrome: Secondary | ICD-10-CM | POA: Diagnosis not present

## 2023-05-20 DIAGNOSIS — Z79891 Long term (current) use of opiate analgesic: Secondary | ICD-10-CM | POA: Diagnosis not present

## 2023-05-20 DIAGNOSIS — M549 Dorsalgia, unspecified: Secondary | ICD-10-CM | POA: Diagnosis not present

## 2023-05-20 DIAGNOSIS — M2550 Pain in unspecified joint: Secondary | ICD-10-CM | POA: Diagnosis not present

## 2023-05-20 DIAGNOSIS — M542 Cervicalgia: Secondary | ICD-10-CM | POA: Diagnosis not present

## 2023-07-15 DIAGNOSIS — M2550 Pain in unspecified joint: Secondary | ICD-10-CM | POA: Diagnosis not present

## 2023-07-15 DIAGNOSIS — M542 Cervicalgia: Secondary | ICD-10-CM | POA: Diagnosis not present

## 2023-07-15 DIAGNOSIS — M549 Dorsalgia, unspecified: Secondary | ICD-10-CM | POA: Diagnosis not present

## 2023-07-15 DIAGNOSIS — Z79891 Long term (current) use of opiate analgesic: Secondary | ICD-10-CM | POA: Diagnosis not present

## 2023-07-15 DIAGNOSIS — G894 Chronic pain syndrome: Secondary | ICD-10-CM | POA: Diagnosis not present

## 2023-07-15 DIAGNOSIS — Z79899 Other long term (current) drug therapy: Secondary | ICD-10-CM | POA: Diagnosis not present

## 2023-09-27 ENCOUNTER — Other Ambulatory Visit: Payer: Self-pay | Admitting: Physician Assistant

## 2023-09-27 ENCOUNTER — Ambulatory Visit: Payer: Self-pay | Admitting: Physician Assistant

## 2023-09-27 ENCOUNTER — Encounter: Payer: Self-pay | Admitting: Physician Assistant

## 2023-09-27 ENCOUNTER — Telehealth: Payer: Self-pay | Admitting: Physician Assistant

## 2023-09-27 VITALS — BP 183/81 | HR 66 | Resp 16 | Ht 73.0 in | Wt 179.6 lb

## 2023-09-27 DIAGNOSIS — H918X3 Other specified hearing loss, bilateral: Secondary | ICD-10-CM

## 2023-09-27 DIAGNOSIS — E785 Hyperlipidemia, unspecified: Secondary | ICD-10-CM | POA: Diagnosis not present

## 2023-09-27 DIAGNOSIS — J449 Chronic obstructive pulmonary disease, unspecified: Secondary | ICD-10-CM | POA: Diagnosis not present

## 2023-09-27 DIAGNOSIS — M51369 Other intervertebral disc degeneration, lumbar region without mention of lumbar back pain or lower extremity pain: Secondary | ICD-10-CM

## 2023-09-27 DIAGNOSIS — I10 Essential (primary) hypertension: Secondary | ICD-10-CM

## 2023-09-27 DIAGNOSIS — Z7689 Persons encountering health services in other specified circumstances: Secondary | ICD-10-CM

## 2023-09-27 DIAGNOSIS — N186 End stage renal disease: Secondary | ICD-10-CM

## 2023-09-27 DIAGNOSIS — Z720 Tobacco use: Secondary | ICD-10-CM

## 2023-09-27 DIAGNOSIS — N184 Chronic kidney disease, stage 4 (severe): Secondary | ICD-10-CM

## 2023-09-27 MED ORDER — ROSUVASTATIN CALCIUM 40 MG PO TABS: 40.0000 mg | ORAL_TABLET | Freq: Every day | ORAL | 1 refills | Status: DC

## 2023-09-27 MED ORDER — BREZTRI AEROSPHERE 160-9-4.8 MCG/ACT IN AERO
2.0000 | INHALATION_SPRAY | Freq: Two times a day (BID) | RESPIRATORY_TRACT | 11 refills | Status: DC
Start: 2023-09-27 — End: 2024-01-17

## 2023-09-27 MED ORDER — ALBUTEROL SULFATE HFA 108 (90 BASE) MCG/ACT IN AERS: INHALATION_SPRAY | RESPIRATORY_TRACT | 1 refills | Status: DC

## 2023-09-27 MED ORDER — VALSARTAN-HYDROCHLOROTHIAZIDE 80-12.5 MG PO TABS
1.0000 | ORAL_TABLET | Freq: Every day | ORAL | 0 refills | Status: DC
Start: 2023-09-27 — End: 2023-11-10

## 2023-09-27 MED ORDER — VITAMIN D 25 MCG (1000 UNIT) PO TABS: 1000.0000 [IU] | ORAL_TABLET | Freq: Every day | ORAL | 1 refills | Status: DC

## 2023-09-27 MED ORDER — AMLODIPINE BESYLATE 5 MG PO TABS: ORAL_TABLET | ORAL | 1 refills | Status: DC

## 2023-09-28 ENCOUNTER — Other Ambulatory Visit: Payer: Self-pay

## 2023-09-28 ENCOUNTER — Ambulatory Visit: Payer: Self-pay | Admitting: Physician Assistant

## 2023-09-28 LAB — CBC WITH DIFFERENTIAL/PLATELET
Basophils Absolute: 0.1 10*3/uL (ref 0.0–0.2)
Basos: 1 %
EOS (ABSOLUTE): 0.2 10*3/uL (ref 0.0–0.4)
Eos: 3 %
Hematocrit: 44.3 % (ref 37.5–51.0)
Hemoglobin: 13.8 g/dL (ref 13.0–17.7)
Immature Grans (Abs): 0 10*3/uL (ref 0.0–0.1)
Immature Granulocytes: 0 %
Lymphocytes Absolute: 2.1 10*3/uL (ref 0.7–3.1)
Lymphs: 30 %
MCH: 31.2 pg (ref 26.6–33.0)
MCHC: 31.2 g/dL — ABNORMAL LOW (ref 31.5–35.7)
MCV: 100 fL — ABNORMAL HIGH (ref 79–97)
Monocytes Absolute: 0.3 10*3/uL (ref 0.1–0.9)
Monocytes: 4 %
Neutrophils Absolute: 4.1 10*3/uL (ref 1.4–7.0)
Neutrophils: 62 %
Platelets: 315 10*3/uL (ref 150–450)
RBC: 4.42 x10E6/uL (ref 4.14–5.80)
RDW: 13.6 % (ref 11.6–15.4)
WBC: 6.8 10*3/uL (ref 3.4–10.8)

## 2023-09-28 LAB — VITAMIN D 25 HYDROXY (VIT D DEFICIENCY, FRACTURES): Vit D, 25-Hydroxy: 30.5 ng/mL (ref 30.0–100.0)

## 2023-09-28 LAB — LIPID PANEL
Chol/HDL Ratio: 5.5 ratio — ABNORMAL HIGH (ref 0.0–5.0)
Cholesterol, Total: 171 mg/dL (ref 100–199)
HDL: 31 mg/dL — ABNORMAL LOW (ref >39)
LDL Chol Calc (NIH): 100 mg/dL — ABNORMAL HIGH (ref 0–99)
Triglycerides: 233 mg/dL — ABNORMAL HIGH (ref 0–149)
VLDL Cholesterol Cal: 40 mg/dL (ref 5–40)

## 2023-09-28 LAB — COMPREHENSIVE METABOLIC PANEL WITH GFR
ALT: 7 IU/L (ref 0–44)
AST: 15 IU/L (ref 0–40)
Albumin: 4.6 g/dL (ref 3.8–4.8)
Alkaline Phosphatase: 85 IU/L (ref 44–121)
BUN/Creatinine Ratio: 7 — ABNORMAL LOW (ref 10–24)
BUN: 14 mg/dL (ref 8–27)
Bilirubin Total: 0.4 mg/dL (ref 0.0–1.2)
CO2: 21 mmol/L (ref 20–29)
Calcium: 9.5 mg/dL (ref 8.6–10.2)
Chloride: 104 mmol/L (ref 96–106)
Creatinine, Ser: 2.12 mg/dL — ABNORMAL HIGH (ref 0.76–1.27)
Globulin, Total: 3.1 g/dL (ref 1.5–4.5)
Glucose: 98 mg/dL (ref 70–99)
Potassium: 5.6 mmol/L — ABNORMAL HIGH (ref 3.5–5.2)
Sodium: 142 mmol/L (ref 134–144)
Total Protein: 7.7 g/dL (ref 6.0–8.5)
eGFR: 32 mL/min/{1.73_m2} — ABNORMAL LOW (ref >59)

## 2023-09-28 LAB — HEMOGLOBIN A1C
Est. average glucose Bld gHb Est-mCnc: 120 mg/dL
Hgb A1c MFr Bld: 5.8 % — ABNORMAL HIGH (ref 4.8–5.6)

## 2023-09-28 LAB — TSH: TSH: 1.13 u[IU]/mL (ref 0.450–4.500)

## 2023-09-28 MED ORDER — EZETIMIBE 10 MG PO TABS: 10.0000 mg | ORAL_TABLET | Freq: Every day | ORAL | 1 refills | Status: DC

## 2023-09-28 NOTE — Progress Notes (Signed)
 Vitamin D  level close to low normal limits.  Advised to continue over-the-counter vitamin D3 1000 units daily Cholesterol levels are elevated.  Advised to continue taking rosuvastatin  40 Mg and add extra medication if he agrees/zetia 10mg  #90 refill 1.  Take 1 tablet by mouth with or without meals daily A1c level of 5.8.  Advise low-carb diet and staying active Kidney function decreased.  Advised to drink plenty of water, avoid NSAIDs Follow-up with nephrology.  However I do not see nephrology on file.  Please check with patient if he needs a referral to be placed for nephrology

## 2023-10-15 ENCOUNTER — Other Ambulatory Visit: Payer: Self-pay | Admitting: Physician Assistant

## 2023-10-15 DIAGNOSIS — G8929 Other chronic pain: Secondary | ICD-10-CM

## 2023-10-15 NOTE — Telephone Encounter (Unsigned)
 Copied from CRM [id number]. Topic: Clinical - Medication Refill >> Oct 15, 2023 10:33 AM Montie POUR wrote: Medication: oxyCODONE -acetaminophen  (PERCOCET) 10-325 MG tablet  Has the patient contacted their pharmacy? Yes (Agent: If no, request that the patient contact the pharmacy for the refill. If patient does not wish to contact the pharmacy document the reason why and proceed with request.) (Agent: If yes, when and what did the pharmacy advise?) Pharmacy needs order to refill  This is the patient's preferred pharmacy:  [facility] #[id number] [place], [place] - [address] AT [facility] [address] [place] [address] Phone: [phone] Fax: [phone]  Is this the correct pharmacy for this prescription? Yes If no, delete pharmacy and type the correct one.   Has the prescription been filled recently? No  Is the patient out of the medication? Yes  Has the patient been seen for an appointment in the last year OR does the patient have an upcoming appointment? Yes  Can we respond through MyChart? No  Agent: Please be advised that Rx refills may take up to 3 business days. We ask that you follow-up with your pharmacy.

## 2023-10-17 NOTE — Telephone Encounter (Signed)
 error

## 2023-10-18 NOTE — Telephone Encounter (Signed)
 Requested medications are due for refill today.  yes  Requested medications are on the active medications list.  yes  Last refill. 11/24/2021 #30 0 rf  Future visit scheduled.   yes  Notes to clinic.  Refill not delegated.    Requested Prescriptions  Pending Prescriptions Disp Refills   oxyCODONE -acetaminophen  (PERCOCET) 10-325 MG tablet 30 tablet 0    Sig: Take 1 tablet by mouth every 4 (four) hours as needed for pain (takes five a day).     Not Delegated - Analgesics:  Opioid Agonist Combinations Failed - 10/18/2023 12:19 PM      Failed - This refill cannot be delegated      Failed - Urine Drug Screen completed in last 360 days      Passed - Valid encounter within last 3 months    Recent Outpatient Visits           3 weeks ago Essential hypertension   [facility] Davidson, Janna, PA-C

## 2023-10-21 ENCOUNTER — Ambulatory Visit: Admitting: Physician Assistant

## 2023-10-21 VITALS — BP 115/70 | HR 82 | Resp 16

## 2023-10-21 DIAGNOSIS — M545 Low back pain, unspecified: Secondary | ICD-10-CM

## 2023-10-21 DIAGNOSIS — R0981 Nasal congestion: Secondary | ICD-10-CM

## 2023-10-21 DIAGNOSIS — I10 Essential (primary) hypertension: Secondary | ICD-10-CM | POA: Diagnosis not present

## 2023-10-21 DIAGNOSIS — N184 Chronic kidney disease, stage 4 (severe): Secondary | ICD-10-CM

## 2023-10-21 DIAGNOSIS — J449 Chronic obstructive pulmonary disease, unspecified: Secondary | ICD-10-CM

## 2023-10-21 DIAGNOSIS — Z720 Tobacco use: Secondary | ICD-10-CM

## 2023-10-21 DIAGNOSIS — E785 Hyperlipidemia, unspecified: Secondary | ICD-10-CM | POA: Diagnosis not present

## 2023-10-21 DIAGNOSIS — G8929 Other chronic pain: Secondary | ICD-10-CM

## 2023-10-21 DIAGNOSIS — R7303 Prediabetes: Secondary | ICD-10-CM

## 2023-10-24 NOTE — Progress Notes (Signed)
 " Established patient visit  Patient: Jerry Galloway   DOB: 08-08-1950   73 y.o. Male  MRN: [id number] Visit Date: 10/21/2023  Today's healthcare provider: Jolynn Spencer, PA-C   Chief Complaint  Patient presents with   Follow-up    F/u  pt wants something sinus and sleep  Pt has been out Bp meds for 1 wk.   Subjective     HPI     Follow-up    Additional comments: F/u  pt wants something sinus and sleep  Pt has been out Bp meds for 1 wk.      Last edited by Marylen Odella CROME, CMA on 10/21/2023  4:01 PM.       Discussed the use of AI scribe software for clinical note transcription with the patient, who gave verbal consent to proceed.  History of Present Illness Jerry Galloway is a 73 year old male with chronic back pain and hypertension who presents for medication management and follow-up.  He experiences chronic back pain managed with oxycodone , which is the only medication that alleviates his pain and allows mobility. He was without his pain medication for nine days, significantly affecting his well-being, but feels much better after resuming it.  He is on amlodipine  and valsartan /hydrochlorothiazide  for hypertension, taking two pills daily, one in the morning and one in the evening. He has been on this regimen for about three months and adheres to it.  He has elevated cholesterol levels and is on cholesterol medications, including a 40 mg dose and ezetimibe  10 mg, but his cholesterol levels remain elevated.  He experiences 'needles and pins' sensations in his feet and legs, which eased after taking his medication. He also experiences shortness of breath but no chest pain or palpitations. He uses Breztri  inhalers daily and feels better today compared to the previous day.  He smokes close to a pack a day, reduced from three packs a day in the past. He has patches to aid in quitting smoking.       09/27/2023   10:28 AM 03/13/2022    3:47 PM 11/17/2021    9:17 AM   Depression screen PHQ 2/9  Decreased Interest 0 2 0  Down, Depressed, Hopeless 0 2 0  PHQ - 2 Score 0 4 0  Altered sleeping 0 1   Tired, decreased energy 0 2   Change in appetite 0 0   Feeling bad or failure about yourself  0 0   Trouble concentrating 0 0   Moving slowly or fidgety/restless 0 0   Suicidal thoughts 0 0   PHQ-9 Score 0 7   Difficult doing work/chores Not difficult at all Not difficult at all       09/27/2023   10:29 AM 03/13/2022    3:47 PM  GAD 7 : Generalized Anxiety Score  Nervous, Anxious, on Edge 0 1  Control/stop worrying 0 0  Worry too much - different things 0 0  Trouble relaxing 0   Restless 0 0  Easily annoyed or irritable 0 0  Afraid - awful might happen 0 0  Total GAD 7 Score 0   Anxiety Difficulty Not difficult at all     Medications: Outpatient Medications Prior to Visit  Medication Sig   albuterol  (VENTOLIN  HFA) 108 (90 Base) MCG/ACT inhaler TAKE 2 PUFFS BY MOUTH EVERY 6 HOURS AS NEEDED FOR WHEEZE OR SHORTNESS OF BREATH   amLODipine  (NORVASC ) 5 MG tablet Take 1 tab twice daily   budesonide-glycopyrrolate-formoterol (BREZTRI  AEROSPHERE)  160-9-4.8 MCG/ACT AERO inhaler Inhale 2 puffs into the lungs 2 (two) times daily.   cholecalciferol (VITAMIN D3) 25 MCG (1000 UNIT) tablet Take 1 tablet (1,000 Units total) by mouth daily.   clonazePAM (KLONOPIN) 0.5 MG tablet Take 1 tablet by mouth 2 (two) times daily as needed.   ezetimibe  (ZETIA ) 10 MG tablet Take 1 tablet (10 mg total) by mouth daily.   gabapentin  (NEURONTIN ) 100 MG capsule TAKE 1 CAPSULE(100 MG) BY MOUTH AT BEDTIME AS NEEDED   oxyCODONE -acetaminophen  (PERCOCET) 10-325 MG tablet Take 1 tablet by mouth every 4 (four) hours as needed for pain (takes five a day).   rosuvastatin  (CRESTOR ) 40 MG tablet Take 1 tablet (40 mg total) by mouth daily.   valsartan -hydrochlorothiazide  (DIOVAN -HCT) 80-12.5 MG tablet Take 1 tablet by mouth daily.   No facility-administered medications prior to visit.     Review of Systems All negative Except see HPI       Objective    BP 115/70 (BP Location: Right Arm, Patient Position: Sitting, Cuff Size: Normal)   Pulse 82   Resp 16   SpO2 98%     Physical Exam Vitals reviewed.  Constitutional:      General: He is not in acute distress.    Appearance: Normal appearance. He is not diaphoretic.  HENT:     Head: Normocephalic and atraumatic.  Eyes:     General: No scleral icterus.    Conjunctiva/sclera: Conjunctivae normal.  Cardiovascular:     Rate and Rhythm: Normal rate and regular rhythm.     Pulses: Normal pulses.     Heart sounds: Normal heart sounds. No murmur heard. Pulmonary:     Effort: Pulmonary effort is normal. No respiratory distress.     Breath sounds: Normal breath sounds. No wheezing or rhonchi.  Musculoskeletal:     Cervical back: Neck supple.     Right lower leg: No edema.     Left lower leg: No edema.  Lymphadenopathy:     Cervical: No cervical adenopathy.  Skin:    General: Skin is warm and dry.     Findings: No rash.  Neurological:     Mental Status: He is alert and oriented to person, place, and time. Mental status is at baseline.  Psychiatric:        Mood and Affect: Mood normal.        Behavior: Behavior normal.      No results found for any visits on 10/21/23.      Assessment & Plan Chronic Back Pain Back pain managed with oxycodone , effective in alleviating symptoms. - medications prescribed and managed by pain clinic - Follow up with pain management clinic. Will follow-up  Chronic Obstructive Pulmonary Disease (COPD) COPD managed with Breztri  inhaler, effective in symptom control. - Continue Breztri  inhaler daily. Will follow-up Managed by pulmonology  Hypertension Chronic and stable Hypertension well-controlled with amlodipine  and valsartan /hydrochlorothiazide . - Continue amlodipine  and valsartan /hydrochlorothiazide . - Monitor blood pressure regularly. Continue  low salt diet and  exercise Will follow-up  Dys # lipidemia Chronic Hyperlipidemia with elevated cholesterol levels, requires medication or lifestyle adjustment. - Consider adjusting cholesterol medication. - Encourage dietary modifications to lower cholesterol. - Advise smoking cessation to improve cholesterol levels.  Prediabetes Chronic Prediabetes with dietary recommendations to reduce sugary intake. - Advise reducing or eliminating soda intake. - Encourage dietary modifications to manage blood sugar levels.  Peripheral Neuropathy Peripheral neuropathy. - Adjust treatment as necessary based on symptoms.  Sinus Congestion Sinus congestion with post-nasal drainage  and morning cough, no pain reported. - Use saline nasal spray followed by Flonase nasal spray. - Consider over-the-counter antihistamines like Allegra, Claritin, or Zyrtec. - Refer to ENT if symptoms persist.  Smoking Cessation Smoking reduced from three packs to one pack a day, plans to quit with patches. - Encourage use of nicotine  patches to aid smoking cessation. - Provide information on resources for smoking cessation support.  General Health Maintenance Discussion on diabetic socks for foot swelling and regular check-ups. - Recommend diabetic socks for foot swelling. - Schedule annual wellness exam with nurse.  Follow-up Follow-up plans for chronic conditions and medication refills. - Schedule follow-up appointment in three months. - Ensure medication refills are up to date.  Dyslipidemia/hyperlipidemia - CBC with Differential/Platelet - Comprehensive metabolic panel with GFR - Lipid panel - TSH  CKD (chronic kidney disease), stage IV (HCC) Will need to update labs - CBC with Differential/Platelet - Comprehensive metabolic panel with GFR - Lipid panel - TSH Will reassess after  receiving lab results Will FU  Essential hypertension - CBC with Differential/Platelet - Comprehensive metabolic panel with GFR -  Lipid panel - TSH   Orders Placed This Encounter  Procedures   CBC with Differential/Platelet   Comprehensive metabolic panel with GFR    Has the patient fasted?:   Yes   Lipid panel    Has the patient fasted?:   Yes   TSH    Return in about 3 months (around 01/21/2024), or AW with RN , chronic condit with me in 3 mo, for chronic disease f/u.   The patient was advised to call back or seek an in-person evaluation if the symptoms worsen or if the condition fails to improve as anticipated.  I discussed the assessment and treatment plan with the patient. The patient was provided an opportunity to ask questions and all were answered. The patient agreed with the plan and demonstrated an understanding of the instructions.  I, Rollie Hynek, PA-C have reviewed all documentation for this visit. The documentation on 10/21/2023  for the exam, diagnosis, procedures, and orders are all accurate and complete.  Jolynn Spencer, [facility], MMS [facility] [place] [phone] (phone) [phone] (fax)  [facility] "

## 2023-10-25 ENCOUNTER — Encounter: Payer: Self-pay | Admitting: Physician Assistant

## 2023-11-10 ENCOUNTER — Other Ambulatory Visit: Payer: Self-pay | Admitting: Physician Assistant

## 2023-11-10 DIAGNOSIS — I10 Essential (primary) hypertension: Secondary | ICD-10-CM

## 2024-01-16 ENCOUNTER — Emergency Department

## 2024-01-16 ENCOUNTER — Inpatient Hospital Stay
Admission: EM | Admit: 2024-01-16 | Discharge: 2024-01-22 | DRG: 683 | Disposition: A | Discharge status: Home / Self Care | Attending: Internal Medicine | Admitting: Internal Medicine

## 2024-01-16 DIAGNOSIS — Z79891 Long term (current) use of opiate analgesic: Secondary | ICD-10-CM

## 2024-01-16 DIAGNOSIS — N179 Acute kidney failure, unspecified: Principal | ICD-10-CM | POA: Diagnosis present

## 2024-01-16 DIAGNOSIS — R531 Weakness: Secondary | ICD-10-CM

## 2024-01-16 DIAGNOSIS — G2581 Restless legs syndrome: Secondary | ICD-10-CM | POA: Diagnosis present

## 2024-01-16 DIAGNOSIS — Z79899 Other long term (current) drug therapy: Secondary | ICD-10-CM

## 2024-01-16 DIAGNOSIS — Z9189 Other specified personal risk factors, not elsewhere classified: Secondary | ICD-10-CM

## 2024-01-16 DIAGNOSIS — M7989 Other specified soft tissue disorders: Secondary | ICD-10-CM | POA: Diagnosis present

## 2024-01-16 DIAGNOSIS — I2510 Atherosclerotic heart disease of native coronary artery without angina pectoris: Secondary | ICD-10-CM | POA: Diagnosis present

## 2024-01-16 DIAGNOSIS — M51369 Other intervertebral disc degeneration, lumbar region without mention of lumbar back pain or lower extremity pain: Secondary | ICD-10-CM | POA: Diagnosis present

## 2024-01-16 DIAGNOSIS — R748 Abnormal levels of other serum enzymes: Secondary | ICD-10-CM | POA: Diagnosis present

## 2024-01-16 DIAGNOSIS — Z1152 Encounter for screening for COVID-19: Secondary | ICD-10-CM

## 2024-01-16 DIAGNOSIS — R809 Proteinuria, unspecified: Secondary | ICD-10-CM | POA: Diagnosis present

## 2024-01-16 DIAGNOSIS — R7989 Other specified abnormal findings of blood chemistry: Secondary | ICD-10-CM | POA: Diagnosis present

## 2024-01-16 DIAGNOSIS — I10 Essential (primary) hypertension: Secondary | ICD-10-CM | POA: Diagnosis present

## 2024-01-16 DIAGNOSIS — E860 Dehydration: Secondary | ICD-10-CM | POA: Diagnosis present

## 2024-01-16 DIAGNOSIS — E8721 Acute metabolic acidosis: Secondary | ICD-10-CM | POA: Diagnosis present

## 2024-01-16 DIAGNOSIS — M069 Rheumatoid arthritis, unspecified: Secondary | ICD-10-CM | POA: Diagnosis present

## 2024-01-16 DIAGNOSIS — E876 Hypokalemia: Secondary | ICD-10-CM | POA: Diagnosis not present

## 2024-01-16 DIAGNOSIS — F17210 Nicotine dependence, cigarettes, uncomplicated: Secondary | ICD-10-CM | POA: Diagnosis present

## 2024-01-16 DIAGNOSIS — Z720 Tobacco use: Secondary | ICD-10-CM | POA: Diagnosis present

## 2024-01-16 DIAGNOSIS — E1122 Type 2 diabetes mellitus with diabetic chronic kidney disease: Secondary | ICD-10-CM | POA: Diagnosis present

## 2024-01-16 DIAGNOSIS — N184 Chronic kidney disease, stage 4 (severe): Secondary | ICD-10-CM | POA: Diagnosis present

## 2024-01-16 DIAGNOSIS — J449 Chronic obstructive pulmonary disease, unspecified: Secondary | ICD-10-CM | POA: Diagnosis present

## 2024-01-16 DIAGNOSIS — E785 Hyperlipidemia, unspecified: Secondary | ICD-10-CM | POA: Diagnosis present

## 2024-01-16 DIAGNOSIS — I129 Hypertensive chronic kidney disease with stage 1 through stage 4 chronic kidney disease, or unspecified chronic kidney disease: Secondary | ICD-10-CM | POA: Diagnosis present

## 2024-01-16 DIAGNOSIS — M542 Cervicalgia: Secondary | ICD-10-CM | POA: Diagnosis present

## 2024-01-16 DIAGNOSIS — N2581 Secondary hyperparathyroidism of renal origin: Secondary | ICD-10-CM | POA: Diagnosis present

## 2024-01-16 DIAGNOSIS — E8770 Fluid overload, unspecified: Secondary | ICD-10-CM | POA: Diagnosis present

## 2024-01-16 DIAGNOSIS — I788 Other diseases of capillaries: Secondary | ICD-10-CM | POA: Diagnosis present

## 2024-01-16 DIAGNOSIS — D631 Anemia in chronic kidney disease: Secondary | ICD-10-CM | POA: Diagnosis present

## 2024-01-16 DIAGNOSIS — G894 Chronic pain syndrome: Secondary | ICD-10-CM | POA: Diagnosis present

## 2024-01-16 LAB — CBC
HCT: 31.4 % — ABNORMAL LOW (ref 39.0–52.0)
Hemoglobin: 10.6 g/dL — ABNORMAL LOW (ref 13.0–17.0)
MCH: 31.9 pg (ref 26.0–34.0)
MCHC: 33.8 g/dL (ref 30.0–36.0)
MCV: 94.6 fL (ref 80.0–100.0)
Platelets: 378 K/uL (ref 150–400)
RBC: 3.32 MIL/uL — ABNORMAL LOW (ref 4.22–5.81)
RDW: 13.7 % (ref 11.5–15.5)
WBC: 14.1 K/uL — ABNORMAL HIGH (ref 4.0–10.5)
nRBC: 0 % (ref 0.0–0.2)

## 2024-01-16 LAB — COMPREHENSIVE METABOLIC PANEL WITH GFR
ALT: 124 U/L — ABNORMAL HIGH (ref 0–44)
AST: 273 U/L — ABNORMAL HIGH (ref 15–41)
Albumin: 2.9 g/dL — ABNORMAL LOW (ref 3.5–5.0)
Alkaline Phosphatase: 60 U/L (ref 38–126)
Anion gap: 13 (ref 5–15)
BUN: 78 mg/dL — ABNORMAL HIGH (ref 8–23)
CO2: 17 mmol/L — ABNORMAL LOW (ref 22–32)
Calcium: 8.3 mg/dL — ABNORMAL LOW (ref 8.9–10.3)
Chloride: 105 mmol/L (ref 98–111)
Creatinine, Ser: 5.92 mg/dL — ABNORMAL HIGH (ref 0.61–1.24)
GFR, Estimated: 9 mL/min — ABNORMAL LOW (ref >60)
Glucose, Bld: 127 mg/dL — ABNORMAL HIGH (ref 70–99)
Potassium: 3.9 mmol/L (ref 3.5–5.1)
Sodium: 135 mmol/L (ref 135–145)
Total Bilirubin: 0.7 mg/dL (ref 0.0–1.2)
Total Protein: 7 g/dL (ref 6.5–8.1)

## 2024-01-16 LAB — URINALYSIS, ROUTINE W REFLEX MICROSCOPIC
Bacteria, UA: NONE SEEN
Bilirubin Urine: NEGATIVE
Glucose, UA: NEGATIVE mg/dL
Ketones, ur: NEGATIVE mg/dL
Leukocytes,Ua: NEGATIVE
Nitrite: NEGATIVE
Protein, ur: 100 mg/dL — AB
Specific Gravity, Urine: 1.016 (ref 1.005–1.030)
pH: 5 (ref 5.0–8.0)

## 2024-01-16 MED ORDER — LACTATED RINGERS IV BOLUS
1000.0000 mL | Freq: Once | INTRAVENOUS | Status: AC
  Administered 2024-01-17: 1000 mL via INTRAVENOUS

## 2024-01-16 NOTE — ED Triage Notes (Signed)
 Patient brought in by POV with c/o not having any strength. Stating he is having trouble walking.

## 2024-01-16 NOTE — ED Provider Notes (Signed)
 [facility] Provider Note    Event Date/Time   First MD Initiated Contact with Patient 01/16/24 2256     (approximate)   History   Weakness   HPI  Jerry Galloway is a 73 y.o. male who presents to the ED for evaluation of Weakness   Review of PCP visit from July.  History of COPD, HTN, chronic low back pain, HLD  Patient presents with his daughter for evaluation of generalized weakness in the past couple days after a 1 week episode of inability to keep anything down.  He reports many days of poor toleration of p.o. intake, recurrent emesis without abdominal pain.  Reports this has resolved in the past couple days but he just feels very weak and so presents to the ED.   Physical Exam   Triage Vital Signs: ED Triage Vitals  Encounter Vitals Group     BP 01/16/24 2200 125/60     Girls Systolic BP Percentile --      Girls Diastolic BP Percentile --      Boys Systolic BP Percentile --      Boys Diastolic BP Percentile --      Pulse Rate 01/16/24 2200 99     Resp 01/16/24 2200 16     Temp 01/16/24 2200 (!) 97.5 F (36.4 C)     Temp Source 01/16/24 2200 Oral     SpO2 01/16/24 2200 95 %     Weight 01/16/24 2202 180 lb (81.6 kg)     Height 01/16/24 2202 6' (1.829 m)     Head Circumference --      Peak Flow --      Pain Score 01/16/24 2202 0     Pain Loc --      Pain Education --      Exclude from Growth Chart --     Most recent vital signs: Vitals:   01/16/24 2300 01/16/24 2330  BP:  122/77  Pulse:  91  Resp:    Temp: 97.8 F (36.6 C)   SpO2:      General: Awake, no distress.  Generally well-appearing CV:  Good peripheral perfusion.  Resp:  Normal effort.  Faint and scattered expiratory wheezes Abd:  No distention.  Soft and nontender MSK:  No deformity noted.  Neuro:  No focal deficits appreciated. Other:     ED Results / Procedures / Treatments   Labs (all labs ordered are listed, but only abnormal results are  displayed) Labs Reviewed  COMPREHENSIVE METABOLIC PANEL WITH GFR - Abnormal; Notable for the following components:      Result Value   CO2 17 (*)    Glucose, Bld 127 (*)    BUN 78 (*)    Creatinine, Ser 5.92 (*)    Calcium  8.3 (*)    Albumin 2.9 (*)    AST 273 (*)    ALT 124 (*)    GFR, Estimated 9 (*)    All other components within normal limits  CBC - Abnormal; Notable for the following components:   WBC 14.1 (*)    RBC 3.32 (*)    Hemoglobin 10.6 (*)    HCT 31.4 (*)    All other components within normal limits  URINALYSIS, ROUTINE W REFLEX MICROSCOPIC - Abnormal; Notable for the following components:   Color, Urine YELLOW (*)    APPearance HAZY (*)    Hgb urine dipstick LARGE (*)    Protein, ur 100 (*)  All other components within normal limits  RESP PANEL BY RT-PCR (RSV, FLU A&B, COVID)  [id number]    EKG Sinus rhythm at a rate of 97 bpm.  Normal axis and intervals.  Nonspecific ST changes without STEMI.  RADIOLOGY RUQ ultrasound interpreted by me without evidence of acute pathology. Ultrasound of the kidneys interpreted by me without hydronephrosis  Official radiology report(s): US  ABDOMEN LIMITED RUQ (LIVER/GB) Result Date: 01/16/2024 CLINICAL DATA:  Transaminitis EXAM: ULTRASOUND ABDOMEN LIMITED RIGHT UPPER QUADRANT COMPARISON:  None Available. FINDINGS: Gallbladder: No gallstones or wall thickening visualized. No sonographic Murphy sign noted by sonographer. Common bile duct: Diameter: Normal caliber, 6 mm Liver: No focal lesion identified. Within normal limits in parenchymal echogenicity. Portal vein is patent on color Doppler imaging with normal direction of blood flow towards the liver. Other: None. IMPRESSION: No acute findings. Electronically Signed   By: Franky Crease M.D.   On: 01/16/2024 23:31   US  Renal Result Date: 01/16/2024 CLINICAL DATA:  Acute renal failure EXAM: RENAL / URINARY TRACT ULTRASOUND COMPLETE COMPARISON:  None Available. FINDINGS: Right  Kidney: Renal measurements: 2.2 x 4.754.7 cm = volume: 116 mL. Cortical thinning. Increased echotexture. No mass or hydronephrosis. Left Kidney: Renal measurements: 10.4 x 5.7 x 5.7 cm = volume: 177 mL. Diffusely increased echotexture. No mass or hydronephrosis. Bladder: Appears normal for degree of bladder distention. Other: None. IMPRESSION: No acute findings.  No hydronephrosis. Increased echotexture compatible with chronic medical renal disease. Electronically Signed   By: Franky Crease M.D.   On: 01/16/2024 23:30    PROCEDURES and INTERVENTIONS:  .1-3 Lead EKG Interpretation  Performed by: Claudene Rover, MD Authorized by: Claudene Rover, MD     Interpretation: normal     ECG rate:  94   ECG rate assessment: normal     Rhythm: sinus rhythm     Ectopy: none     Conduction: normal   .Ultrasound ED Peripheral IV (Provider)  Date/Time: 01/17/2024 12:09 AM  Performed by: Claudene Rover, MD Authorized by: Claudene Rover, MD   Procedure details:    Indications: multiple failed IV attempts and poor IV access     Skin Prep: chlorhexidine gluconate     Location: right cephalic v.   Angiocath:  18 G   Bedside Ultrasound Guided: Yes     Images: not archived     Patient tolerated procedure without complications: Yes     Dressing applied: Yes     Medications  lactated ringers bolus 1,000 mL (1,000 mLs Intravenous New Bag/Given 01/17/24 0002)     IMPRESSION / MDM / ASSESSMENT AND PLAN / ED COURSE  I reviewed the triage vital signs and the nursing notes.  Differential diagnosis includes, but is not limited to, renal failure, symptomatic anemia, hyperkalemia, sepsis, UTI, biliary colic  {Patient presents with symptoms of an acute illness or injury that is potentially life-threatening.  Patient presents from home with signs of renal failure requiring medical admission.  Likely prerenal due to his recurrent emesis over the past week, elevated BUN.  Start IV fluids.  He is still making urine, UA  without infectious features.  No signs of hydronephrosis on imaging, soft and benign abdomen.  Consult medicine for admission.  Clinical Course as of 01/17/24 0009  Mon Jan 17, 2024  0008 Consult medicine who agrees to admit [DS]  0009 Please ultrasound IVU due to nursing staff having difficult time with peripheral IV. [DS]    Clinical Course User Index [DS] Claudene Rover, MD  FINAL CLINICAL IMPRESSION(S) / ED DIAGNOSES   Final diagnoses:  AKI (acute kidney injury)  Generalized weakness     Rx / DC Orders   ED Discharge Orders     None        Note:  This document was prepared using Dragon voice recognition software and may include unintentional dictation errors.   Claudene Rover, MD 01/16/24 7652    Claudene Rover, MD 01/17/24 [phone]

## 2024-01-17 ENCOUNTER — Encounter: Payer: Self-pay | Admitting: Internal Medicine

## 2024-01-17 ENCOUNTER — Other Ambulatory Visit: Payer: Self-pay

## 2024-01-17 DIAGNOSIS — E876 Hypokalemia: Secondary | ICD-10-CM | POA: Diagnosis not present

## 2024-01-17 DIAGNOSIS — E1122 Type 2 diabetes mellitus with diabetic chronic kidney disease: Secondary | ICD-10-CM | POA: Diagnosis present

## 2024-01-17 DIAGNOSIS — E8770 Fluid overload, unspecified: Secondary | ICD-10-CM | POA: Diagnosis present

## 2024-01-17 DIAGNOSIS — R809 Proteinuria, unspecified: Secondary | ICD-10-CM | POA: Diagnosis present

## 2024-01-17 DIAGNOSIS — I10 Essential (primary) hypertension: Secondary | ICD-10-CM

## 2024-01-17 DIAGNOSIS — E860 Dehydration: Secondary | ICD-10-CM | POA: Diagnosis present

## 2024-01-17 DIAGNOSIS — N2581 Secondary hyperparathyroidism of renal origin: Secondary | ICD-10-CM | POA: Diagnosis present

## 2024-01-17 DIAGNOSIS — N179 Acute kidney failure, unspecified: Secondary | ICD-10-CM | POA: Diagnosis present

## 2024-01-17 DIAGNOSIS — Z9189 Other specified personal risk factors, not elsewhere classified: Secondary | ICD-10-CM | POA: Diagnosis not present

## 2024-01-17 DIAGNOSIS — J449 Chronic obstructive pulmonary disease, unspecified: Secondary | ICD-10-CM

## 2024-01-17 DIAGNOSIS — N184 Chronic kidney disease, stage 4 (severe): Secondary | ICD-10-CM

## 2024-01-17 DIAGNOSIS — G894 Chronic pain syndrome: Secondary | ICD-10-CM | POA: Diagnosis present

## 2024-01-17 DIAGNOSIS — M069 Rheumatoid arthritis, unspecified: Secondary | ICD-10-CM | POA: Diagnosis present

## 2024-01-17 DIAGNOSIS — Z1152 Encounter for screening for COVID-19: Secondary | ICD-10-CM | POA: Diagnosis not present

## 2024-01-17 DIAGNOSIS — I788 Other diseases of capillaries: Secondary | ICD-10-CM | POA: Diagnosis present

## 2024-01-17 DIAGNOSIS — G2581 Restless legs syndrome: Secondary | ICD-10-CM | POA: Diagnosis present

## 2024-01-17 DIAGNOSIS — I129 Hypertensive chronic kidney disease with stage 1 through stage 4 chronic kidney disease, or unspecified chronic kidney disease: Secondary | ICD-10-CM | POA: Diagnosis present

## 2024-01-17 DIAGNOSIS — R7989 Other specified abnormal findings of blood chemistry: Secondary | ICD-10-CM | POA: Diagnosis present

## 2024-01-17 DIAGNOSIS — E785 Hyperlipidemia, unspecified: Secondary | ICD-10-CM | POA: Diagnosis present

## 2024-01-17 DIAGNOSIS — E8721 Acute metabolic acidosis: Secondary | ICD-10-CM | POA: Diagnosis present

## 2024-01-17 DIAGNOSIS — D631 Anemia in chronic kidney disease: Secondary | ICD-10-CM | POA: Diagnosis present

## 2024-01-17 DIAGNOSIS — I2510 Atherosclerotic heart disease of native coronary artery without angina pectoris: Secondary | ICD-10-CM | POA: Diagnosis present

## 2024-01-17 DIAGNOSIS — Z79899 Other long term (current) drug therapy: Secondary | ICD-10-CM | POA: Diagnosis not present

## 2024-01-17 DIAGNOSIS — M542 Cervicalgia: Secondary | ICD-10-CM | POA: Diagnosis present

## 2024-01-17 DIAGNOSIS — R748 Abnormal levels of other serum enzymes: Secondary | ICD-10-CM | POA: Diagnosis present

## 2024-01-17 DIAGNOSIS — M7989 Other specified soft tissue disorders: Secondary | ICD-10-CM | POA: Diagnosis present

## 2024-01-17 DIAGNOSIS — M51369 Other intervertebral disc degeneration, lumbar region without mention of lumbar back pain or lower extremity pain: Secondary | ICD-10-CM | POA: Diagnosis present

## 2024-01-17 DIAGNOSIS — F17210 Nicotine dependence, cigarettes, uncomplicated: Secondary | ICD-10-CM | POA: Diagnosis present

## 2024-01-17 LAB — RENAL FUNCTION PANEL
Albumin: 2.5 g/dL — ABNORMAL LOW (ref 3.5–5.0)
Anion gap: 10 (ref 5–15)
BUN: 76 mg/dL — ABNORMAL HIGH (ref 8–23)
CO2: 19 mmol/L — ABNORMAL LOW (ref 22–32)
Calcium: 8.2 mg/dL — ABNORMAL LOW (ref 8.9–10.3)
Chloride: 107 mmol/L (ref 98–111)
Creatinine, Ser: 5.71 mg/dL — ABNORMAL HIGH (ref 0.61–1.24)
GFR, Estimated: 10 mL/min — ABNORMAL LOW (ref >60)
Glucose, Bld: 118 mg/dL — ABNORMAL HIGH (ref 70–99)
Phosphorus: 3.8 mg/dL (ref 2.5–4.6)
Potassium: 3.6 mmol/L (ref 3.5–5.1)
Sodium: 136 mmol/L (ref 135–145)

## 2024-01-17 LAB — CBC
HCT: 29.8 % — ABNORMAL LOW (ref 39.0–52.0)
Hemoglobin: 9.8 g/dL — ABNORMAL LOW (ref 13.0–17.0)
MCH: 31.8 pg (ref 26.0–34.0)
MCHC: 32.9 g/dL (ref 30.0–36.0)
MCV: 96.8 fL (ref 80.0–100.0)
Platelets: 331 K/uL (ref 150–400)
RBC: 3.08 MIL/uL — ABNORMAL LOW (ref 4.22–5.81)
RDW: 13.7 % (ref 11.5–15.5)
WBC: 12 K/uL — ABNORMAL HIGH (ref 4.0–10.5)
nRBC: 0 % (ref 0.0–0.2)

## 2024-01-17 LAB — RESP PANEL BY RT-PCR (RSV, FLU A&B, COVID)  RVPGX2
Influenza A by PCR: NEGATIVE
Influenza B by PCR: NEGATIVE
Resp Syncytial Virus by PCR: NEGATIVE
SARS Coronavirus 2 by RT PCR: NEGATIVE

## 2024-01-17 LAB — HEPATIC FUNCTION PANEL
ALT: 98 U/L — ABNORMAL HIGH (ref 0–44)
AST: 222 U/L — ABNORMAL HIGH (ref 15–41)
Albumin: 2.1 g/dL — ABNORMAL LOW (ref 3.5–5.0)
Alkaline Phosphatase: 44 U/L (ref 38–126)
Bilirubin, Direct: 0.2 mg/dL (ref 0.0–0.2)
Indirect Bilirubin: 0.3 mg/dL (ref 0.3–0.9)
Total Bilirubin: 0.5 mg/dL (ref 0.0–1.2)
Total Protein: 5.4 g/dL — ABNORMAL LOW (ref 6.5–8.1)

## 2024-01-17 MED ORDER — ACETAMINOPHEN 650 MG RE SUPP: 650.0000 mg | Freq: Four times a day (QID) | RECTAL | Status: DC | PRN

## 2024-01-17 MED ORDER — OXYCODONE-ACETAMINOPHEN 10-325 MG PO TABS: 1.0000 | ORAL_TABLET | ORAL | Status: DC | PRN

## 2024-01-17 MED ORDER — OXYCODONE HCL 5 MG PO TABS
5.0000 mg | ORAL_TABLET | ORAL | Status: AC | PRN
Start: 2024-01-17 — End: ?
  Administered 2024-01-17 – 2024-01-22 (×22): 5 mg via ORAL
  Filled 2024-01-17 (×22): qty 1

## 2024-01-17 MED ORDER — CAMPHOR-MENTHOL 0.5-0.5 % EX LOTN: 1.0000 | TOPICAL_LOTION | Freq: Three times a day (TID) | CUTANEOUS | Status: DC | PRN

## 2024-01-17 MED ORDER — ALBUTEROL SULFATE (2.5 MG/3ML) 0.083% IN NEBU: 2.5000 mg | INHALATION_SOLUTION | Freq: Four times a day (QID) | RESPIRATORY_TRACT | Status: DC | PRN

## 2024-01-17 MED ORDER — LACTATED RINGERS IV SOLN: INTRAVENOUS | Status: AC

## 2024-01-17 MED ORDER — HYDROMORPHONE HCL 1 MG/ML IJ SOLN
0.5000 mg | INTRAMUSCULAR | Status: DC | PRN
  Administered 2024-01-17: 0.5 mg via INTRAVENOUS
  Filled 2024-01-17 (×2): qty 0.5

## 2024-01-17 MED ORDER — DOCUSATE SODIUM 283 MG RE ENEM
1.0000 | ENEMA | RECTAL | Status: DC | PRN
  Filled 2024-01-17: qty 1

## 2024-01-17 MED ORDER — SORBITOL 70 % SOLN: 30.0000 mL | Status: DC | PRN

## 2024-01-17 MED ORDER — HYDROXYZINE HCL 25 MG PO TABS: 25.0000 mg | ORAL_TABLET | Freq: Three times a day (TID) | ORAL | Status: DC | PRN

## 2024-01-17 MED ORDER — HEPARIN SODIUM (PORCINE) 5000 UNIT/ML IJ SOLN
5000.0000 [IU] | Freq: Three times a day (TID) | INTRAMUSCULAR | Status: DC
  Administered 2024-01-17 – 2024-01-22 (×16): 5000 [IU] via SUBCUTANEOUS
  Filled 2024-01-17 (×16): qty 1

## 2024-01-17 MED ORDER — OXYCODONE-ACETAMINOPHEN 5-325 MG PO TABS
1.0000 | ORAL_TABLET | ORAL | Status: DC | PRN
  Administered 2024-01-17 – 2024-01-22 (×23): 1 via ORAL
  Filled 2024-01-17 (×23): qty 1

## 2024-01-17 MED ORDER — ACETAMINOPHEN 325 MG PO TABS: 650.0000 mg | ORAL_TABLET | Freq: Four times a day (QID) | ORAL | Status: DC | PRN

## 2024-01-17 MED ORDER — AMLODIPINE BESYLATE 5 MG PO TABS
5.0000 mg | ORAL_TABLET | Freq: Every day | ORAL | Status: DC
  Administered 2024-01-18 – 2024-01-22 (×5): 5 mg via ORAL
  Filled 2024-01-17 (×5): qty 1

## 2024-01-17 MED ORDER — NICOTINE 21 MG/24HR TD PT24
21.0000 mg | MEDICATED_PATCH | Freq: Every day | TRANSDERMAL | Status: AC
Start: 2024-01-17 — End: ?
  Administered 2024-01-17 – 2024-01-22 (×6): 21 mg via TRANSDERMAL
  Filled 2024-01-17 (×6): qty 1

## 2024-01-17 MED ORDER — ROSUVASTATIN CALCIUM 20 MG PO TABS: 40.0000 mg | ORAL_TABLET | Freq: Every day | ORAL | Status: DC

## 2024-01-17 MED ORDER — ONDANSETRON HCL 4 MG PO TABS: 4.0000 mg | ORAL_TABLET | Freq: Four times a day (QID) | ORAL | Status: DC | PRN

## 2024-01-17 MED ORDER — ONDANSETRON HCL 4 MG/2ML IJ SOLN: 4.0000 mg | Freq: Four times a day (QID) | INTRAMUSCULAR | Status: DC | PRN

## 2024-01-17 MED ORDER — NEPRO/CARBSTEADY PO LIQD: 237.0000 mL | Freq: Three times a day (TID) | ORAL | Status: DC | PRN

## 2024-01-17 MED ORDER — CALCIUM CARBONATE ANTACID 1250 MG/5ML PO SUSP: 500.0000 mg | Freq: Four times a day (QID) | ORAL | Status: DC | PRN

## 2024-01-17 NOTE — ED Notes (Signed)
 Informed patient of shared room. Expresses no concerns. Awaiting transportation to floor.

## 2024-01-17 NOTE — Plan of Care (Signed)

## 2024-01-17 NOTE — Progress Notes (Signed)
 Patient requesting a nicotine  patch. Said he smokes about 3 PPD. Order received from Dr Jens for the patch

## 2024-01-17 NOTE — Progress Notes (Signed)
 BP 100/49. Dr Jens said to hold a.m. norvasc 

## 2024-01-17 NOTE — H&P (Signed)
 History and Physical    Patient: Jerry Galloway FMW:[id number] DOB: 09-17-1950 DOA: 01/16/2024 DOS: the patient was seen and examined on 01/17/2024 PCP: Dineen Channel, PA-C  Patient coming from: Home  Chief Complaint:  Chief Complaint  Patient presents with   Weakness   HPI: Jerry Galloway is a 73 y.o. male with medical history significant of essential hypertension, chronic kidney disease stage IV, chronic pain syndrome, chronic benzodiazepines use, degenerative disc disease, COPD, tobacco abuse, history of right renal mass previously, restless leg syndrome, hyperlipidemia, who presented to the ER with intractable nausea with vomiting for the last few days.  Patient also has generalized weakness for the last 1 week patient has been unable to keep anything down.  Denied any sick contacts.  Denied any diarrhea.  No hematuric emesis no melena.  Patient denied taking NSAIDs.  He is on narcotics and he has been taking his medications.  He has not had any new medicines.  No marijuana use.  Patient was seen in the ER.  He was visibly dehydrated.  He has not had much urine.  Evaluation showed creatinine of 5.92 from his baseline of 2.12.  Patient also has a leukocytosis and anemia.  Renal ultrasound and CT abdominal ultrasound showed no acute findings.  His LFTs were notably elevated at this point has been admitted with AKI on CKD4.  Review of Systems: As mentioned in the history of present illness. All other systems reviewed and are negative. Past Medical History:  Diagnosis Date   Chronic pain    CKD (chronic kidney disease)    HTN (hypertension)    No past surgical history on file. Social History:  reports that he has been smoking cigarettes. He has a 60 pack-year smoking history. He has never used smokeless tobacco. He reports that he does not drink alcohol and does not use drugs.  No Known Allergies  No family history on file.  Prior to Admission medications   Medication Sig  Start Date End Date Taking? Authorizing Provider  albuterol  (VENTOLIN  HFA) 108 (90 Base) MCG/ACT inhaler TAKE 2 PUFFS BY MOUTH EVERY 6 HOURS AS NEEDED FOR WHEEZE OR SHORTNESS OF BREATH 09/27/23   Ostwalt, Janna, PA-C  amLODipine  (NORVASC ) 5 MG tablet Take 1 tab twice daily 09/27/23   Ostwalt, Janna, PA-C  budesonide-glycopyrrolate-formoterol (BREZTRI  AEROSPHERE) 160-9-4.8 MCG/ACT AERO inhaler Inhale 2 puffs into the lungs 2 (two) times daily. 09/27/23   Ostwalt, Janna, PA-C  cholecalciferol (VITAMIN D3) 25 MCG (1000 UNIT) tablet Take 1 tablet (1,000 Units total) by mouth daily. 09/27/23   Ostwalt, Janna, PA-C  clonazePAM (KLONOPIN) 0.5 MG tablet Take 1 tablet by mouth 2 (two) times daily as needed. 01/16/14   [provider]  ezetimibe  (ZETIA ) 10 MG tablet Take 1 tablet (10 mg total) by mouth daily. 09/28/23   Ostwalt, Janna, PA-C  gabapentin  (NEURONTIN ) 100 MG capsule TAKE 1 CAPSULE(100 MG) BY MOUTH AT BEDTIME AS NEEDED 04/22/22   Bacchus, Gloria Z, FNP  oxyCODONE -acetaminophen  (PERCOCET) 10-325 MG tablet Take 1 tablet by mouth every 4 (four) hours as needed for pain (takes five a day). 11/24/21   Bacchus, Meade PEDLAR, FNP  rosuvastatin  (CRESTOR ) 40 MG tablet Take 1 tablet (40 mg total) by mouth daily. 09/27/23   Ostwalt, Janna, PA-C  valsartan -hydrochlorothiazide  (DIOVAN -HCT) 80-12.5 MG tablet TAKE 1 TABLET BY MOUTH DAILY 11/10/23   Ostwalt, Janna, PA-C    Physical Exam: Vitals:   01/16/24 2200 01/16/24 2202 01/16/24 2300 01/16/24 2330  BP: 125/60  122/77  Pulse: 99   91  Resp: 16     Temp: (!) 97.5 F (36.4 C)  97.8 F (36.6 C)   TempSrc: Oral  Oral   SpO2: 95%     Weight:  81.6 kg    Height:  6' (1.829 m)     Constitutional: Acutely ill looking NAD, calm, comfortable Eyes: PERRL, lids and conjunctivae normal ENMT: Mucous membranes are dry. Posterior pharynx clear of any exudate or lesions.Normal dentition.  Neck: normal, supple, no masses, no thyromegaly Respiratory: clear to  auscultation bilaterally, no wheezing, no crackles. Normal respiratory effort. No accessory muscle use.  Cardiovascular: Regular rate and rhythm, no murmurs / rubs / gallops. No extremity edema. 2+ pedal pulses. No carotid bruits.  Abdomen: no tenderness, no masses palpated. No hepatosplenomegaly. Bowel sounds positive.  Musculoskeletal: Good range of motion, no joint swelling or tenderness, Skin: no rashes, lesions, ulcers. No induration Neurologic: CN 2-12 grossly intact. Sensation intact, DTR normal. Strength 5/5 in all 4.  Psychiatric: Normal judgment and insight. Alert and oriented x 3. Normal mood  Data Reviewed:  Temperature 97.5, white count 14.1 hemoglobin 10.6, CO2 17 BUN 70 creatinine 5.91 calcium  8.3 glucose 127, albumin 2.9, AST 273 ALT 124 renal ultrasound shows no acute findings no hydronephrosis abdominal ultrasound shows no acute findings  Assessment and Plan:  #1 AKI on CKD 4: Most likely prerenal.  Imaging showed no obstructive uropathy on other intra-abdominal findings.  We will admit the patient.  Aggressively hydrate.  Follow renal function.  Continue other supportive care  #2 intractable nausea with vomiting: No cause found.  Patient reported having the nausea vomiting for about a week.  At this point we will continue with mainly symptomatic treatment.  No diarrhea and no hematemesis.  #3 COPD: No acute exacerbation  #4 essential hypertension: Continue with blood pressure monitoring.  #5 hyperlipidemia: Continue statin  #6 tobacco abuse: Counseling provided.  Offered nicotine .  #7 elevation of LFTs: Will hold patient's statin.  Not sure of cause.  Could be dehydration.  Could be reactive.  #8 chronic pain syndrome: Continue chronic pain medications from home    Advance Care Planning:   Code Status: Full Code   Consults: None for now.  May get nephrology consult in the morning  Family Communication: Wife and daughter at bedside  Severity of Illness: The  appropriate patient status for this patient is INPATIENT. Inpatient status is judged to be reasonable and necessary in order to provide the required intensity of service to ensure the patient's safety. The patient's presenting symptoms, physical exam findings, and initial radiographic and laboratory data in the context of their chronic comorbidities is felt to place them at high risk for further clinical deterioration. Furthermore, it is not anticipated that the patient will be medically stable for discharge from the hospital within 2 midnights of admission.   * I certify that at the point of admission it is my clinical judgment that the patient will require inpatient hospital care spanning beyond 2 midnights from the point of admission due to high intensity of service, high risk for further deterioration and high frequency of surveillance required.*  AuthorBETHA SIM KNOLL, MD 01/17/2024 12:12 AM  For on call review [email or web address].

## 2024-01-17 NOTE — Progress Notes (Signed)
 Interval events noted.  He complains of neck pain from poor positioning.  No other complaints.  He feels better compared to yesterday.  No vomiting, abdominal pain or diarrhea.  No shortness of breath or chest pain.  He has adequate urine output.  Briefly, Mr. Jerry Galloway is a 73 year old man with medical history significant for hypertension, CKD stage IV, chronic pain syndrome on chronic opioids for pain, history of right renal mass, restless leg syndrome, hyperlipidemia, who presented to the hospital with intractable nausea, vomiting and general weakness.  He was admitted to the hospital for acute kidney injury on CKD stage IV.  Continue IV fluids for hydration and monitor BMP.  Will consider nephrology consult if there is no improvement in kidney function.

## 2024-01-17 NOTE — Progress Notes (Signed)
 Mobility Specialist - Progress Note    01/17/24 0900  Mobility  Activity Ambulated with assistance;Stood at bedside  Level of Assistance Contact guard assist, steadying assist  Assistive Device Front wheel walker  Distance Ambulated (ft) 20 ft  Range of Motion/Exercises All extremities  Activity Response Tolerated well  Mobility visit 1 Mobility  Mobility Specialist Start Time (ACUTE ONLY) 0902  Mobility Specialist Stop Time (ACUTE ONLY) [id number]  Mobility Specialist Time Calculation (min) (ACUTE ONLY) 21 min     Pt was supine in bed upon entry. Pt agreed to mobility. Pt is hard of hearing. Pt did complain about neck soreness and L LE tightness. Pt was able to get to EOB independently using bed features. Pt was able to STS with minA CGA and 2 [employer]. Pt ambulated short distance and complained about left leg soreness. After activity pt returned to the bed in chair position. Bed alarm is on and needs are within reach.  Clem Rodes Mobility Specialist 01/17/24, 9:38 AM

## 2024-01-17 NOTE — TOC CM/SW Note (Signed)
 Transition of Care [facility]) - Inpatient Brief Assessment   Patient Details  Name: Jerry Galloway MRN: [id number] Date of Birth: 1950-11-05  Transition of Care [facility]) CM/SW Contact:    Corean ONEIDA Haddock, RN Phone Number: 01/17/2024, 11:05 AM   Clinical Narrative:   Transition of Care [facility]) Screening Note   Patient Details  Name: Jerry Galloway Date of Birth: 1951/01/22   Transition of Care [facility]) CM/SW Contact:    Corean ONEIDA Haddock, RN Phone Number: 01/17/2024, 11:06 AM    Transition of Care Department [facility]) has reviewed patient and no TOC needs have been identified at this time.  If new patient transition needs arise, please place a TOC consult.    Transition of Care Asessment: Insurance and Status: Insurance coverage has been reviewed Patient has primary care physician: Yes     Prior/Current Home Services: No current home services Social Drivers of Health Review: SDOH reviewed no interventions necessary Readmission risk has been reviewed: Yes Transition of care needs: no transition of care needs at this time

## 2024-01-18 ENCOUNTER — Ambulatory Visit: Admitting: Physician Assistant

## 2024-01-18 DIAGNOSIS — N184 Chronic kidney disease, stage 4 (severe): Secondary | ICD-10-CM | POA: Diagnosis not present

## 2024-01-18 DIAGNOSIS — N179 Acute kidney failure, unspecified: Secondary | ICD-10-CM | POA: Diagnosis not present

## 2024-01-18 LAB — CBC
HCT: 28.8 % — ABNORMAL LOW (ref 39.0–52.0)
Hemoglobin: 9.5 g/dL — ABNORMAL LOW (ref 13.0–17.0)
MCH: 31.8 pg (ref 26.0–34.0)
MCHC: 33 g/dL (ref 30.0–36.0)
MCV: 96.3 fL (ref 80.0–100.0)
Platelets: 295 K/uL (ref 150–400)
RBC: 2.99 MIL/uL — ABNORMAL LOW (ref 4.22–5.81)
RDW: 13.7 % (ref 11.5–15.5)
WBC: 12.2 K/uL — ABNORMAL HIGH (ref 4.0–10.5)
nRBC: 0 % (ref 0.0–0.2)

## 2024-01-18 LAB — COMPREHENSIVE METABOLIC PANEL WITH GFR
ALT: 96 U/L — ABNORMAL HIGH (ref 0–44)
AST: 199 U/L — ABNORMAL HIGH (ref 15–41)
Albumin: 2.1 g/dL — ABNORMAL LOW (ref 3.5–5.0)
Alkaline Phosphatase: 46 U/L (ref 38–126)
Anion gap: 9 (ref 5–15)
BUN: 64 mg/dL — ABNORMAL HIGH (ref 8–23)
CO2: 20 mmol/L — ABNORMAL LOW (ref 22–32)
Calcium: 8.1 mg/dL — ABNORMAL LOW (ref 8.9–10.3)
Chloride: 108 mmol/L (ref 98–111)
Creatinine, Ser: 4.98 mg/dL — ABNORMAL HIGH (ref 0.61–1.24)
GFR, Estimated: 11 mL/min — ABNORMAL LOW (ref >60)
Glucose, Bld: 107 mg/dL — ABNORMAL HIGH (ref 70–99)
Potassium: 3.7 mmol/L (ref 3.5–5.1)
Sodium: 137 mmol/L (ref 135–145)
Total Bilirubin: 0.6 mg/dL (ref 0.0–1.2)
Total Protein: 5.6 g/dL — ABNORMAL LOW (ref 6.5–8.1)

## 2024-01-18 MED ORDER — LACTATED RINGERS IV SOLN: INTRAVENOUS | Status: AC

## 2024-01-18 NOTE — Progress Notes (Signed)
 Progress Note    Jerry Galloway  FMW:[id number] DOB: July 16, 1950  DOA: 01/16/2024 PCP: Ostwalt, Janna, PA-C      Brief Narrative:    Medical records reviewed and are as summarized below:  Jerry Galloway is a 73 y.o. male with medical history significant for hypertension, CKD stage IV, chronic pain syndrome on chronic opioids for pain, history of right renal mass, restless leg syndrome, hyperlipidemia, who presented to the hospital with intractable nausea, vomiting and general weakness.   Initial vitals in the ED: Temperature 97.5 F, respiratory 16, pulse 99, BP 125/60, O2 sat 95% on room air.  Labs significant for BUN of 78, creatinine 5.92, bicarb 17, AST 273, ALT 124, GFR 9, WBC 14.1, hemoglobin 10.6    Assessment/Plan:   Principal Problem:   Acute on chronic kidney failure Active Problems:   DDD (degenerative disc disease), lumbar   History of drug overdose (02/18/2020)   Long-term current use of benzodiazepine   Dyslipidemia   Essential hypertension   Rheumatoid arthritis (HCC)   Chronic pain syndrome   CKD (chronic kidney disease), stage IV (HCC)   Tobacco use   COPD (chronic obstructive pulmonary disease) (HCC)   LFT elevation    Body mass index is 23.35 kg/m.   AKI on CKD stage IV: Creatinine is slowly trending down.  Creatinine down from 5.92-5.71-4.98.  Continue IV lactate infusion but decrease fluid rate from 125 mL/h to 75 mL/h.  Monitor BMP. BUN and creatinine were 14 and 2.12 respectively on 09/27/2023   Nausea and vomiting: Improved.  Antiemetics as needed.   Elevated liver enzymes: Etiology unclear.  Liver enzymes are slowly trending downward.   COPD: Stable.   Comorbidities include hypertension, hyperlipidemia, tobacco use disorder, chronic pain syndrome   Diet Order             Diet Heart Fluid consistency: Thin  Diet effective now                                   Consultants: None  Procedures: None    Medications:    amLODipine   5 mg Oral Daily   heparin  5,000 Units Subcutaneous Q8H   nicotine   21 mg Transdermal Daily   Continuous Infusions:  lactated ringers 75 mL/hr at 01/18/24 0836     Anti-infectives (From admission, onward)    None              Family Communication/Anticipated D/C date and plan/Code Status   DVT prophylaxis: heparin injection 5,000 Units Start: 01/17/24 0600     Code Status: Full Code  Family Communication: None Disposition Plan: Plan to discharge home   Status is: Inpatient Remains inpatient appropriate because: AKI       Subjective:   Interval events noted.  He complains of nausea and vomited this morning but it was only a small amount.  No abdominal pain or diarrhea.  He has adequate urine output.  Objective:    Vitals:   01/17/24 1503 01/17/24 1952 01/18/24 0422 01/18/24 0806  BP: 103/70 (!) 105/54 (!) 104/48 120/63  Pulse: 81 77 82 85  Resp: 18 16 16 18   Temp: 98 F (36.7 C) 97.9 F (36.6 C) 98.1 F (36.7 C) 98 F (36.7 C)  TempSrc: Oral Oral    SpO2: 97% 96% 95% 97%  Weight:      Height:  No data found.   Intake/Output Summary (Last 24 hours) at 01/18/2024 1632 Last data filed at 01/18/2024 1029 Gross per 24 hour  Intake --  Output 1200 ml  Net -1200 ml   Filed Weights   01/16/24 2202 01/17/24 0500  Weight: 81.6 kg 78.1 kg    Exam:  GEN: NAD SKIN: Warm and dry EYES: No pallor or icterus ENT: MMM CV: RRR PULM: CTA B ABD: soft, ND, NT, +BS CNS: AAO x 3, non focal EXT: No edema or tenderness        Data Reviewed:   I have personally reviewed following labs and imaging studies:  Labs: Labs show the following:   Basic Metabolic Panel: Recent Labs  Lab 01/16/24 2210 01/17/24 0406 01/18/24 0350  NA 135 136 137  K 3.9 3.6 3.7  CL 105 107 108  CO2 17* 19* 20*  GLUCOSE 127* 118* 107*  BUN 78*  76* 64*  CREATININE 5.92* 5.71* 4.98*  CALCIUM  8.3* 8.2* 8.1*  PHOS  --  3.8  --    GFR Estimated Creatinine Clearance: 14.1 mL/min (A) (by C-G formula based on SCr of 4.98 mg/dL (H)). Liver Function Tests: Recent Labs  Lab 01/16/24 2210 01/17/24 0406 01/17/24 0805 01/18/24 0350  AST 273*  --  222* 199*  ALT 124*  --  98* 96*  ALKPHOS 60  --  44 46  BILITOT 0.7  --  0.5 0.6  PROT 7.0  --  5.4* 5.6*  ALBUMIN 2.9* 2.5* 2.1* 2.1*   No results for input(s): LIPASE, AMYLASE in the last 168 hours. No results for input(s): AMMONIA in the last 168 hours. Coagulation profile No results for input(s): INR, PROTIME in the last 168 hours.  CBC: Recent Labs  Lab 01/16/24 2210 01/17/24 0406 01/18/24 0350  WBC 14.1* 12.0* 12.2*  HGB 10.6* 9.8* 9.5*  HCT 31.4* 29.8* 28.8*  MCV 94.6 96.8 96.3  PLT 378 331 295   Cardiac Enzymes: No results for input(s): CKTOTAL, CKMB, CKMBINDEX, TROPONINI in the last 168 hours. BNP (last 3 results) No results for input(s): PROBNP in the last 8760 hours. CBG: No results for input(s): GLUCAP in the last 168 hours. D-Dimer: No results for input(s): DDIMER in the last 72 hours. Hgb A1c: No results for input(s): HGBA1C in the last 72 hours. Lipid Profile: No results for input(s): CHOL, HDL, LDLCALC, TRIG, CHOLHDL, LDLDIRECT in the last 72 hours. Thyroid function studies: No results for input(s): TSH, T4TOTAL, T3FREE, THYROIDAB in the last 72 hours.  Invalid input(s): FREET3 Anemia work up: No results for input(s): VITAMINB12, FOLATE, FERRITIN, TIBC, IRON, RETICCTPCT in the last 72 hours. Sepsis Labs: Recent Labs  Lab 01/16/24 2210 01/17/24 0406 01/18/24 0350  WBC 14.1* 12.0* 12.2*    Microbiology Recent Results (from the past 240 hours)  Resp panel by RT-PCR (RSV, Flu A&B, Covid) Anterior Nasal Swab     Status: None   Collection Time: 01/16/24 11:32 PM   Specimen: Anterior Nasal  Swab  Result Value Ref Range Status   SARS Coronavirus 2 by RT PCR NEGATIVE NEGATIVE Final    Comment: (NOTE) SARS-CoV-2 target nucleic acids are NOT DETECTED.  The SARS-CoV-2 RNA is generally detectable in upper respiratory specimens during the acute phase of infection. The lowest concentration of SARS-CoV-2 viral copies this assay can detect is 138 copies/mL. A negative result does not preclude SARS-Cov-2 infection and should not be used as the sole basis for treatment or other patient management decisions. A negative result  may occur with  improper specimen collection/handling, submission of specimen other than nasopharyngeal swab, presence of viral mutation(s) within the areas targeted by this assay, and inadequate number of viral copies(<138 copies/mL). A negative result must be combined with clinical observations, patient history, and epidemiological information. The expected result is Negative.  Fact Sheet for Patients:  BloggerCourse.com  Fact Sheet for Healthcare Providers:  SeriousBroker.it  This test is no t yet approved or cleared by the [place]  FDA and  has been authorized for detection and/or diagnosis of SARS-CoV-2 by FDA under an Emergency Use Authorization (EUA). This EUA will remain  in effect (meaning this test can be used) for the duration of the COVID-19 declaration under Section 564(b)(1) of the Act, 21 U.S.C.section 360bbb-3(b)(1), unless the authorization is terminated  or revoked sooner.       Influenza A by PCR NEGATIVE NEGATIVE Final   Influenza B by PCR NEGATIVE NEGATIVE Final    Comment: (NOTE) The Xpert Xpress SARS-CoV-2/FLU/RSV plus assay is intended as an aid in the diagnosis of influenza from Nasopharyngeal swab specimens and should not be used as a sole basis for treatment. Nasal washings and aspirates are unacceptable for Xpert Xpress SARS-CoV-2/FLU/RSV testing.  Fact Sheet for  Patients: BloggerCourse.com  Fact Sheet for Healthcare Providers: SeriousBroker.it  This test is not yet approved or cleared by the [place]  FDA and has been authorized for detection and/or diagnosis of SARS-CoV-2 by FDA under an Emergency Use Authorization (EUA). This EUA will remain in effect (meaning this test can be used) for the duration of the COVID-19 declaration under Section 564(b)(1) of the Act, 21 U.S.C. section 360bbb-3(b)(1), unless the authorization is terminated or revoked.     Resp Syncytial Virus by PCR NEGATIVE NEGATIVE Final    Comment: (NOTE) Fact Sheet for Patients: BloggerCourse.com  Fact Sheet for Healthcare Providers: SeriousBroker.it  This test is not yet approved or cleared by the [place]  FDA and has been authorized for detection and/or diagnosis of SARS-CoV-2 by FDA under an Emergency Use Authorization (EUA). This EUA will remain in effect (meaning this test can be used) for the duration of the COVID-19 declaration under Section 564(b)(1) of the Act, 21 U.S.C. section 360bbb-3(b)(1), unless the authorization is terminated or revoked.  Performed at [facility], [address] [place], [place] [address]     Procedures and diagnostic studies:  US  ABDOMEN LIMITED RUQ (LIVER/GB) Result Date: 01/16/2024 CLINICAL DATA:  Transaminitis EXAM: ULTRASOUND ABDOMEN LIMITED RIGHT UPPER QUADRANT COMPARISON:  None Available. FINDINGS: Gallbladder: No gallstones or wall thickening visualized. No sonographic Murphy sign noted by sonographer. Common bile duct: Diameter: Normal caliber, 6 mm Liver: No focal lesion identified. Within normal limits in parenchymal echogenicity. Portal vein is patent on color Doppler imaging with normal direction of blood flow towards the liver. Other: None. IMPRESSION: No acute findings. Electronically Signed   By: Franky Crease M.D.   On: 01/16/2024 23:31   US  Renal Result Date: 01/16/2024 CLINICAL DATA:  Acute renal failure EXAM: RENAL / URINARY TRACT ULTRASOUND COMPLETE COMPARISON:  None Available. FINDINGS: Right Kidney: Renal measurements: 2.2 x 4.754.7 cm = volume: 116 mL. Cortical thinning. Increased echotexture. No mass or hydronephrosis. Left Kidney: Renal measurements: 10.4 x 5.7 x 5.7 cm = volume: 177 mL. Diffusely increased echotexture. No mass or hydronephrosis. Bladder: Appears normal for degree of bladder distention. Other: None. IMPRESSION: No acute findings.  No hydronephrosis. Increased echotexture compatible with chronic medical renal disease. Electronically Signed   By: Franky  Dover M.D.   On: 01/16/2024 23:30               LOS: 1 day   Zayvier Caravello  [facility]   Pager on [email or web address]. If 7PM-7AM, please contact night-coverage at [email or web address]     01/18/2024, 4:32 PM

## 2024-01-18 NOTE — Plan of Care (Signed)

## 2024-01-18 NOTE — Plan of Care (Signed)

## 2024-01-18 NOTE — Progress Notes (Signed)
 Mobility Specialist - Progress Note   01/18/24 1158  Mobility  Activity Ambulated with assistance;Respositioned in chair  Level of Assistance Contact guard assist, steadying assist  Assistive Device Front wheel walker  Distance Ambulated (ft) 30 ft  Range of Motion/Exercises All extremities  Activity Response Tolerated well  Mobility visit 1 Mobility  Mobility Specialist Start Time (ACUTE ONLY) 1145  Mobility Specialist Stop Time (ACUTE ONLY) 1157  Mobility Specialist Time Calculation (min) (ACUTE ONLY) 12 min   Pt was in the recliner upon entry. Pt agreed to mobility. Pt is able to STS with 2WW and CGA. Pt was connected to IV. Pt ambulated well. Pt stated that he felt tired during ambulation. O2 was taken and O2 remained above 94% on RA. After activity pt returned to the room repositioned in the recliner with needs in reach and chair alarm on.  Clem Rodes Mobility Specialist 01/18/24, 12:04 PM

## 2024-01-18 NOTE — Progress Notes (Signed)
 Mobility Specialist - Progress Note   01/18/24 1100  Mobility  Activity Stood at bedside;Pivoted/transferred from bed to chair;Respositioned in chair  Level of Assistance Modified independent, requires aide device or extra time  Assistive Device Front wheel walker  Range of Motion/Exercises Right arm;Right leg;Left leg  Activity Response Tolerated fair  Mobility visit 1 Mobility  Mobility Specialist Start Time (ACUTE ONLY) 1041  Mobility Specialist Stop Time (ACUTE ONLY) 1100  Mobility Specialist Time Calculation (min) (ACUTE ONLY) 19 min     Pt was in chair position in bed upon entry. Pt stated that his neck was still a little sore. Pt agreed to mobility. Pt was able to EOB with 2 WW and bed features. Pt was able to STS with 2 [employer] and minA CGA. Pt was able to pivot to the recliner with needs in reach and chair alarm on. Will try to ambulate after medication sets in.  Jerry Galloway Mobility Specialist 01/18/24, 11:14 AM

## 2024-01-19 DIAGNOSIS — N179 Acute kidney failure, unspecified: Secondary | ICD-10-CM | POA: Diagnosis not present

## 2024-01-19 DIAGNOSIS — N184 Chronic kidney disease, stage 4 (severe): Secondary | ICD-10-CM | POA: Diagnosis not present

## 2024-01-19 LAB — COMPREHENSIVE METABOLIC PANEL WITH GFR
ALT: 87 U/L — ABNORMAL HIGH (ref 0–44)
AST: 143 U/L — ABNORMAL HIGH (ref 15–41)
Albumin: 2.4 g/dL — ABNORMAL LOW (ref 3.5–5.0)
Alkaline Phosphatase: 43 U/L (ref 38–126)
Anion gap: 12 (ref 5–15)
BUN: 57 mg/dL — ABNORMAL HIGH (ref 8–23)
CO2: 20 mmol/L — ABNORMAL LOW (ref 22–32)
Calcium: 8.3 mg/dL — ABNORMAL LOW (ref 8.9–10.3)
Chloride: 105 mmol/L (ref 98–111)
Creatinine, Ser: 4.68 mg/dL — ABNORMAL HIGH (ref 0.61–1.24)
GFR, Estimated: 12 mL/min — ABNORMAL LOW (ref >60)
Glucose, Bld: 124 mg/dL — ABNORMAL HIGH (ref 70–99)
Potassium: 3.4 mmol/L — ABNORMAL LOW (ref 3.5–5.1)
Sodium: 137 mmol/L (ref 135–145)
Total Bilirubin: 0.7 mg/dL (ref 0.0–1.2)
Total Protein: 5.9 g/dL — ABNORMAL LOW (ref 6.5–8.1)

## 2024-01-19 LAB — CBC
HCT: 30.3 % — ABNORMAL LOW (ref 39.0–52.0)
Hemoglobin: 9.8 g/dL — ABNORMAL LOW (ref 13.0–17.0)
MCH: 31.6 pg (ref 26.0–34.0)
MCHC: 32.3 g/dL (ref 30.0–36.0)
MCV: 97.7 fL (ref 80.0–100.0)
Platelets: 311 K/uL (ref 150–400)
RBC: 3.1 MIL/uL — ABNORMAL LOW (ref 4.22–5.81)
RDW: 13.9 % (ref 11.5–15.5)
WBC: 8.9 K/uL (ref 4.0–10.5)
nRBC: 0 % (ref 0.0–0.2)

## 2024-01-19 MED ORDER — POTASSIUM CHLORIDE CRYS ER 20 MEQ PO TBCR
20.0000 meq | EXTENDED_RELEASE_TABLET | Freq: Once | ORAL | Status: AC
  Administered 2024-01-19: 20 meq via ORAL
  Filled 2024-01-19: qty 1

## 2024-01-19 NOTE — Care Management Important Message (Signed)
 Important Message  Patient Details  Name: Jerry Galloway MRN: [id number] Date of Birth: [birth year]   Important Message Given:  Yes - Medicare IM     [name] 01/19/2024, 4:40 PM

## 2024-01-19 NOTE — Progress Notes (Signed)
 Progress Note    Jerry Galloway  FMW:[id number] DOB: [birth year]  DOA: 01/16/2024 PCP: Ostwalt, Janna, PA-C      Brief Narrative:    Medical records reviewed and are as summarized below:  Jerry Galloway is a 73 y.o. male with medical history significant for hypertension, CKD stage IV, chronic pain syndrome on chronic opioids for pain, history of right renal mass, restless leg syndrome, hyperlipidemia, who presented to the hospital with intractable nausea, vomiting and general weakness.   Initial vitals in the ED: Temperature 97.5 F, respiratory 16, pulse 99, BP 125/60, O2 sat 95% on room air.  Labs significant for BUN of 78, creatinine 5.92, bicarb 17, AST 273, ALT 124, GFR 9, WBC 14.1, hemoglobin 10.6    Assessment/Plan:   Principal Problem:   Acute on chronic kidney failure Active Problems:   DDD (degenerative disc disease), lumbar   History of drug overdose (02/18/2020)   Long-term current use of benzodiazepine   Dyslipidemia   Essential hypertension   Rheumatoid arthritis (HCC)   Chronic pain syndrome   CKD (chronic kidney disease), stage IV (HCC)   Tobacco use   COPD (chronic obstructive pulmonary disease) (HCC)   LFT elevation    Body mass index is 24.31 kg/m.   AKI on CKD stage IV versus CKD stage V, normal anion gap metabolic acidosis: Creatinine is slowly trending down.  Creatinine down from 5.92-5.71-4.98-4.68.  It appears it is still not back baseline.  However, IV fluids will be discontinued because of peripheral edema/fluid overload. Consulted Dr. Marcelino, nephrologist, to assist with management. BUN and creatinine were 14 and 2.12 respectively on 09/27/2023   Nausea and vomiting: Improved.  Antiemetics as needed.   Mild hypokalemia: Replete potassium and monitor levels   Elevated liver enzymes: Etiology unclear.  Liver enzymes continue to trend downward.  Monitor closely.   COPD: Stable.   Comorbidities include hypertension,  hyperlipidemia, tobacco use disorder, chronic pain syndrome, anemia of chronic disease   Diet Order             Diet Heart Fluid consistency: Thin  Diet effective now                                  Consultants: Nephrologist  Procedures: None    Medications:    amLODipine   5 mg Oral Daily   heparin  5,000 Units Subcutaneous Q8H   nicotine   21 mg Transdermal Daily   Continuous Infusions:     Anti-infectives (From admission, onward)    None              Family Communication/Anticipated D/C date and plan/Code Status   DVT prophylaxis: heparin injection 5,000 Units Start: 01/17/24 0600     Code Status: Full Code  Family Communication: None Disposition Plan: Plan to discharge home   Status is: Inpatient Remains inpatient appropriate because: AKI       Subjective:   Interval events noted.  He complains of swelling in the arms and legs.  No shortness of breath, chest pain, nausea or vomiting. Jerry Galloway, daughter, at bedside  Objective:    Vitals:   01/18/24 2139 01/19/24 0500 01/19/24 0501 01/19/24 0830  BP: (!) 109/55  (!) 117/54 129/60  Pulse: 79  75 77  Resp: 19  18 18   Temp: 97.6 F (36.4 C)  98 F (36.7 C) 97.6 F (36.4 C)  TempSrc: Oral  Oral  SpO2: 98%  96% 98%  Weight:  81.3 kg    Height:       No data found.   Intake/Output Summary (Last 24 hours) at 01/19/2024 0958 Last data filed at 01/19/2024 0900 Gross per 24 hour  Intake 1384.59 ml  Output 1925 ml  Net -540.41 ml   Filed Weights   01/16/24 2202 01/17/24 0500 01/19/24 0500  Weight: 81.6 kg 78.1 kg 81.3 kg    Exam:  GEN: NAD SKIN: Warm and dry EYES: No ENT: MMM CV: RRR PULM: CTA B ABD: soft, ND, NT, +BS CNS: AAO x 3, non focal EXT: B/l upper and lower extremity edema, no erythema or tenderness        Data Reviewed:   I have personally reviewed following labs and imaging studies:  Labs: Labs show the following:   Basic  Metabolic Panel: Recent Labs  Lab 01/16/24 2210 01/17/24 0406 01/18/24 0350 01/19/24 0618  NA 135 136 137 137  K 3.9 3.6 3.7 3.4*  CL 105 107 108 105  CO2 17* 19* 20* 20*  GLUCOSE 127* 118* 107* 124*  BUN 78* 76* 64* 57*  CREATININE 5.92* 5.71* 4.98* 4.68*  CALCIUM  8.3* 8.2* 8.1* 8.3*  PHOS  --  3.8  --   --    GFR Estimated Creatinine Clearance: 15 mL/min (A) (by C-G formula based on SCr of 4.68 mg/dL (H)). Liver Function Tests: Recent Labs  Lab 01/16/24 2210 01/17/24 0406 01/17/24 0805 01/18/24 0350 01/19/24 0618  AST 273*  --  222* 199* 143*  ALT 124*  --  98* 96* 87*  ALKPHOS 60  --  44 46 43  BILITOT 0.7  --  0.5 0.6 0.7  PROT 7.0  --  5.4* 5.6* 5.9*  ALBUMIN 2.9* 2.5* 2.1* 2.1* 2.4*   No results for input(s): LIPASE, AMYLASE in the last 168 hours. No results for input(s): AMMONIA in the last 168 hours. Coagulation profile No results for input(s): INR, PROTIME in the last 168 hours.  CBC: Recent Labs  Lab 01/16/24 2210 01/17/24 0406 01/18/24 0350 01/19/24 0618  WBC 14.1* 12.0* 12.2* 8.9  HGB 10.6* 9.8* 9.5* 9.8*  HCT 31.4* 29.8* 28.8* 30.3*  MCV 94.6 96.8 96.3 97.7  PLT 378 331 295 311   Cardiac Enzymes: No results for input(s): CKTOTAL, CKMB, CKMBINDEX, TROPONINI in the last 168 hours. BNP (last 3 results) No results for input(s): PROBNP in the last 8760 hours. CBG: No results for input(s): GLUCAP in the last 168 hours. D-Dimer: No results for input(s): DDIMER in the last 72 hours. Hgb A1c: No results for input(s): HGBA1C in the last 72 hours. Lipid Profile: No results for input(s): CHOL, HDL, LDLCALC, TRIG, CHOLHDL, LDLDIRECT in the last 72 hours. Thyroid function studies: No results for input(s): TSH, T4TOTAL, T3FREE, THYROIDAB in the last 72 hours.  Invalid input(s): FREET3 Anemia work up: No results for input(s): VITAMINB12, FOLATE, FERRITIN, TIBC, IRON, RETICCTPCT in the last 72  hours. Sepsis Labs: Recent Labs  Lab 01/16/24 2210 01/17/24 0406 01/18/24 0350 01/19/24 0618  WBC 14.1* 12.0* 12.2* 8.9    Microbiology Recent Results (from the past 240 hours)  Resp panel by RT-PCR (RSV, Flu A&B, Covid) Anterior Nasal Swab     Status: None   Collection Time: 01/16/24 11:32 PM   Specimen: Anterior Nasal Swab  Result Value Ref Range Status   SARS Coronavirus 2 by RT PCR NEGATIVE NEGATIVE Final    Comment: (NOTE) SARS-CoV-2 target nucleic acids are NOT  DETECTED.  The SARS-CoV-2 RNA is generally detectable in upper respiratory specimens during the acute phase of infection. The lowest concentration of SARS-CoV-2 viral copies this assay can detect is 138 copies/mL. A negative result does not preclude SARS-Cov-2 infection and should not be used as the sole basis for treatment or other patient management decisions. A negative result may occur with  improper specimen collection/handling, submission of specimen other than nasopharyngeal swab, presence of viral mutation(s) within the areas targeted by this assay, and inadequate number of viral copies(<138 copies/mL). A negative result must be combined with clinical observations, patient history, and epidemiological information. The expected result is Negative.  Fact Sheet for Patients:  BloggerCourse.com  Fact Sheet for Healthcare Providers:  SeriousBroker.it  This test is no t yet approved or cleared by the [place]  FDA and  has been authorized for detection and/or diagnosis of SARS-CoV-2 by FDA under an Emergency Use Authorization (EUA). This EUA will remain  in effect (meaning this test can be used) for the duration of the COVID-19 declaration under Section 564(b)(1) of the Act, 21 U.S.C.section 360bbb-3(b)(1), unless the authorization is terminated  or revoked sooner.       Influenza A by PCR NEGATIVE NEGATIVE Final   Influenza B by PCR NEGATIVE  NEGATIVE Final    Comment: (NOTE) The Xpert Xpress SARS-CoV-2/FLU/RSV plus assay is intended as an aid in the diagnosis of influenza from Nasopharyngeal swab specimens and should not be used as a sole basis for treatment. Nasal washings and aspirates are unacceptable for Xpert Xpress SARS-CoV-2/FLU/RSV testing.  Fact Sheet for Patients: BloggerCourse.com  Fact Sheet for Healthcare Providers: SeriousBroker.it  This test is not yet approved or cleared by the [place]  FDA and has been authorized for detection and/or diagnosis of SARS-CoV-2 by FDA under an Emergency Use Authorization (EUA). This EUA will remain in effect (meaning this test can be used) for the duration of the COVID-19 declaration under Section 564(b)(1) of the Act, 21 U.S.C. section 360bbb-3(b)(1), unless the authorization is terminated or revoked.     Resp Syncytial Virus by PCR NEGATIVE NEGATIVE Final    Comment: (NOTE) Fact Sheet for Patients: BloggerCourse.com  Fact Sheet for Healthcare Providers: SeriousBroker.it  This test is not yet approved or cleared by the [place]  FDA and has been authorized for detection and/or diagnosis of SARS-CoV-2 by FDA under an Emergency Use Authorization (EUA). This EUA will remain in effect (meaning this test can be used) for the duration of the COVID-19 declaration under Section 564(b)(1) of the Act, 21 U.S.C. section 360bbb-3(b)(1), unless the authorization is terminated or revoked.  Performed at [facility], [address] [place], [place] [address]     Procedures and diagnostic studies:  No results found.              LOS: 2 days   Arcangel Minion  [facility]   Pager on [email or web address]. If 7PM-7AM, please contact night-coverage at [email or web address]     01/19/2024, 9:58 AM

## 2024-01-19 NOTE — Progress Notes (Signed)
 Mobility Specialist - Progress Note  Pre-mobility:  SpO2-93%  During mobility:, SpO2-90%  Post-mobility: , SPO2-98%     01/19/24 1300  Mobility  Activity Ambulated with assistance  Level of Assistance Independent after set-up  Assistive Device Front wheel walker  Distance Ambulated (ft) 30 ft  Range of Motion/Exercises All extremities  Activity Response Tolerated well  Mobility visit 1 Mobility  Mobility Specialist Start Time (ACUTE ONLY) 1205  Mobility Specialist Stop Time (ACUTE ONLY) 1215  Mobility Specialist Time Calculation (min) (ACUTE ONLY) 10 min   Pt was in chair position in bed upon entry. Pt agreed to mobility. Pt is able to get to EOB independently with bed features. Pt is able to STS with 2 WW and bed features. Pt ambulated well and O2 vitals remained above 90% throughout activity. After activity pt returned to the EOB with needs in reach.   Clem Rodes Mobility Specialist 01/19/24, 1:16 PM

## 2024-01-19 NOTE — Consult Note (Signed)
 [facility]  CONSULT NOTE    Date: 01/19/2024                  Patient Name:  Jerry Galloway  MRN: [id number]  DOB: January 28, 1951  Age / Sex: 73 y.o., male         PCP: Dineen Channel, PA-C                 Service Requesting Consult: TRH                 Reason for Consult: Acute kidney injury            History of Present Illness: Mr. Jerry Galloway is a 73 y.o.  male with past medical history of hypertension, pain syndrome, DDD, COPD, RLS, hyperlipidemia, who was admitted to [facility] on 01/16/2024 for Acute on chronic kidney failure [N17.9, N18.9] Generalized weakness [R53.1] AKI (acute kidney injury) [N17.9]  Patient presents to the emergency department complaining of weakness and difficulty ambulating.  Patient seen sitting at side of bed, daughter at bedside.  Daughter states patient had multiple days of nausea, vomiting, and poor oral intake.  Unaware of any precipitating factors.  Denies sick contacts.  Denies diarrhea or NSAID use.  States he has continued to take all medications prescribed to him.  States he has been able to tolerate meals since admission, denies nausea or vomiting.  Labs on ED arrival concerning for serum bicarb 17, BUN 78, creatinine 5.92 with GFR of 9, AST 273 and an ALT 124, white count 14.1 and hemoglobin 10.6.  Respiratory panel negative for influenza, COVID-19, and RSV.  UA appears hazy with mild proteinuria.  Renal ultrasound shows atrophic right kidney without obstruction.  Abdominal ultrasound negative.  Renal function has shown some improvement with IV hydration.   Medications: Outpatient medications: Medications Prior to Admission  Medication Sig Dispense Refill Last Dose/Taking   albuterol  (VENTOLIN  HFA) 108 (90 Base) MCG/ACT inhaler TAKE 2 PUFFS BY MOUTH EVERY 6 HOURS AS NEEDED FOR WHEEZE OR SHORTNESS OF BREATH 6.7 each 1 01/16/2024   amLODipine  (NORVASC ) 5 MG tablet Take 1 tab twice daily 90 tablet 1 01/16/2024  Morning   cyclobenzaprine (FLEXERIL) 5 MG tablet Take 5 mg by mouth at bedtime as needed.   Unknown   oxyCODONE -acetaminophen  (PERCOCET) 10-325 MG tablet Take 1 tablet by mouth every 4 (four) hours as needed for pain (takes five a day). 30 tablet 0 01/16/2024 Evening   gabapentin  (NEURONTIN ) 100 MG capsule TAKE 1 CAPSULE(100 MG) BY MOUTH AT BEDTIME AS NEEDED (Patient not taking: Reported on 01/17/2024) 30 capsule 0 Not Taking   rosuvastatin  (CRESTOR ) 40 MG tablet Take 1 tablet (40 mg total) by mouth daily. (Patient not taking: Reported on 01/17/2024) 90 tablet 1 Not Taking    Current medications: Current Facility-Administered Medications  Medication Dose Route Frequency Provider Last Rate Last Admin   acetaminophen  (TYLENOL ) tablet 650 mg  650 mg Oral Q6H PRN Patel, Kishan S, RPH       Or   acetaminophen  (TYLENOL ) suppository 650 mg  650 mg Rectal Q6H PRN Patel, Kishan S, RPH       albuterol  (PROVENTIL ) (2.5 MG/3ML) 0.083% nebulizer solution 2.5 mg  2.5 mg Inhalation Q6H PRN Garba, Mohammad L, MD       amLODipine  (NORVASC ) tablet 5 mg  5 mg Oral Daily Garba, Mohammad L, MD   5 mg at 01/19/24 0845   calcium  carbonate (dosed in mg elemental calcium ) suspension  500 mg of elemental calcium   500 mg of elemental calcium  Oral Q6H PRN Garba, Mohammad L, MD       camphor-menthol (SARNA) lotion 1 Application  1 Application Topical Q8H PRN Garba, Mohammad L, MD       And   hydrOXYzine (ATARAX) tablet 25 mg  25 mg Oral Q8H PRN Garba, Mohammad L, MD       docusate sodium (ENEMEEZ) enema 283 mg  1 enema Rectal PRN Garba, Mohammad L, MD       feeding supplement (NEPRO CARB STEADY) liquid 237 mL  237 mL Oral TID PRN Garba, Mohammad L, MD       heparin injection 5,000 Units  5,000 Units Subcutaneous Q8H Garba, Mohammad L, MD   5,000 Units at 01/19/24 1304   HYDROmorphone (DILAUDID) injection 0.5 mg  0.5 mg Intravenous Q4H PRN Shona Laurence N, DO   0.5 mg at 01/17/24 1213   nicotine  (NICODERM CQ  - dosed in mg/24  hours) patch 21 mg  21 mg Transdermal Daily Jens Durand, MD   21 mg at 01/19/24 0847   ondansetron  (ZOFRAN ) tablet 4 mg  4 mg Oral Q6H PRN Sim Emery CROME, MD       Or   ondansetron  (ZOFRAN ) injection 4 mg  4 mg Intravenous Q6H PRN Sim Emery CROME, MD       oxyCODONE -acetaminophen  (PERCOCET/ROXICET) 5-325 MG per tablet 1 tablet  1 tablet Oral Q4H PRN Sim Emery CROME, MD   1 tablet at 01/19/24 1304   And   oxyCODONE  (Oxy IR/ROXICODONE ) immediate release tablet 5 mg  5 mg Oral Q4H PRN Sim Emery CROME, MD   5 mg at 01/19/24 1304   potassium chloride SA (KLOR-CON M) CR tablet 20 mEq  20 mEq Oral Once Ayiku, Bernard, MD       sorbitol 70 % solution 30 mL  30 mL Oral PRN Sim Emery CROME, MD          Allergies: No Known Allergies    Past Medical History: Past Medical History:  Diagnosis Date   Chronic pain    CKD (chronic kidney disease)    HTN (hypertension)      Past Surgical History: History reviewed. No pertinent surgical history.   Family History: History reviewed. No pertinent family history.   Social History: Social History   Socioeconomic History   Marital status: Married    Spouse name: Not on file   Number of children: Not on file   Years of education: Not on file   Highest education level: Not on file  Occupational History   Not on file  Tobacco Use   Smoking status: Every Day    Current packs/day: 1.00    Average packs/day: 1 pack/day for 60.0 years (60.0 ttl pk-yrs)    Types: Cigarettes   Smokeless tobacco: Never  Substance and Sexual Activity   Alcohol use: Never   Drug use: Never   Sexual activity: Not on file  Other Topics Concern   Not on file  Social History Narrative   Not on file   Social Drivers of Health   Financial Resource Strain: Low Risk  (10/17/2021)   Overall Financial Resource Strain (CARDIA)    Difficulty of Paying Living Expenses: Not hard at all  Food Insecurity: No Food Insecurity (01/17/2024)   Hunger Vital Sign     Worried About Running Out of Food in the Last Year: Never true    Ran Out of Food in the Last Year:  Never true  Transportation Needs: No Transportation Needs (01/17/2024)   PRAPARE - Administrator, Civil Service (Medical): No    Lack of Transportation (Non-Medical): No  Physical Activity: Sufficiently Active (10/17/2021)   Exercise Vital Sign    Days of Exercise per Week: 7 days    Minutes of Exercise per Session: 30 min  Stress: No Stress Concern Present (10/17/2021)   [employer] of Occupational Health - Occupational Stress Questionnaire    Feeling of Stress : Not at all  Social Connections: Moderately Isolated (01/17/2024)   Social Connection and Isolation Panel    Frequency of Communication with Friends and Family: More than three times a week    Frequency of Social Gatherings with Friends and Family: Three times a week    Attends Religious Services: More than 4 times per year    Active Member of Clubs or Organizations: No    Attends Banker Meetings: Never    Marital Status: Separated  Intimate Partner Violence: Not At Risk (01/17/2024)   Humiliation, Afraid, Rape, and Kick questionnaire    Fear of Current or Ex-Partner: No    Emotionally Abused: No    Physically Abused: No    Sexually Abused: No     Review of Systems: Review of Systems  Constitutional:  Negative for chills, fever and malaise/fatigue.  HENT:  Negative for congestion, sore throat and tinnitus.   Eyes:  Negative for blurred vision and redness.  Respiratory:  Negative for cough, shortness of breath and wheezing.   Cardiovascular:  Negative for chest pain, palpitations, claudication and leg swelling.  Gastrointestinal:  Negative for abdominal pain, blood in stool, diarrhea, nausea and vomiting.  Genitourinary:  Negative for flank pain, frequency and hematuria.  Musculoskeletal:  Negative for back pain, falls and myalgias.  Skin:  Negative for rash.  Neurological:  Positive  for weakness. Negative for dizziness and headaches.  Endo/Heme/Allergies:  Does not bruise/bleed easily.  Psychiatric/Behavioral:  Negative for depression. The patient is not nervous/anxious and does not have insomnia.     Vital Signs: Blood pressure 129/60, pulse 77, temperature 97.6 F (36.4 C), resp. rate 18, height 6' (1.829 m), weight 81.3 kg, SpO2 98%.  Weight trends: Filed Weights   01/16/24 2202 01/17/24 0500 01/19/24 0500  Weight: 81.6 kg 78.1 kg 81.3 kg    Physical Exam: General: NAD, sitting at bedside  Head: Normocephalic, atraumatic. Moist oral mucosal membranes  Eyes: Anicteric  Lungs:  Clear to auscultation, room air  Heart: Regular rate and rhythm  Abdomen:  Soft, nontender,   Extremities: Trace lower extremity peripheral edema.  1+ bilateral upper extremity  Neurologic: Alert and oriented  Skin: No lesions        Lab results: Basic Metabolic Panel: Recent Labs  Lab 01/17/24 0406 01/18/24 0350 01/19/24 0618  NA 136 137 137  K 3.6 3.7 3.4*  CL 107 108 105  CO2 19* 20* 20*  GLUCOSE 118* 107* 124*  BUN 76* 64* 57*  CREATININE 5.71* 4.98* 4.68*  CALCIUM  8.2* 8.1* 8.3*  PHOS 3.8  --   --     Liver Function Tests: Recent Labs  Lab 01/17/24 0805 01/18/24 0350 01/19/24 0618  AST 222* 199* 143*  ALT 98* 96* 87*  ALKPHOS 44 46 43  BILITOT 0.5 0.6 0.7  PROT 5.4* 5.6* 5.9*  ALBUMIN 2.1* 2.1* 2.4*   No results for input(s): LIPASE, AMYLASE in the last 168 hours. No results for input(s): AMMONIA in the last  168 hours.  CBC: Recent Labs  Lab 01/16/24 2210 01/17/24 0406 01/18/24 0350 01/19/24 0618  WBC 14.1* 12.0* 12.2* 8.9  HGB 10.6* 9.8* 9.5* 9.8*  HCT 31.4* 29.8* 28.8* 30.3*  MCV 94.6 96.8 96.3 97.7  PLT 378 331 295 311    Cardiac Enzymes: No results for input(s): CKTOTAL, CKMB, CKMBINDEX, TROPONINI in the last 168 hours.  BNP: Invalid input(s): POCBNP  CBG: No results for input(s): GLUCAP in the last 168  hours.  Microbiology: Results for orders placed or performed during the hospital encounter of 01/16/24  Resp panel by RT-PCR (RSV, Flu A&B, Covid) Anterior Nasal Swab     Status: None   Collection Time: 01/16/24 11:32 PM   Specimen: Anterior Nasal Swab  Result Value Ref Range Status   SARS Coronavirus 2 by RT PCR NEGATIVE NEGATIVE Final    Comment: (NOTE) SARS-CoV-2 target nucleic acids are NOT DETECTED.  The SARS-CoV-2 RNA is generally detectable in upper respiratory specimens during the acute phase of infection. The lowest concentration of SARS-CoV-2 viral copies this assay can detect is 138 copies/mL. A negative result does not preclude SARS-Cov-2 infection and should not be used as the sole basis for treatment or other patient management decisions. A negative result may occur with  improper specimen collection/handling, submission of specimen other than nasopharyngeal swab, presence of viral mutation(s) within the areas targeted by this assay, and inadequate number of viral copies(<138 copies/mL). A negative result must be combined with clinical observations, patient history, and epidemiological information. The expected result is Negative.  Fact Sheet for Patients:  BloggerCourse.com  Fact Sheet for Healthcare Providers:  SeriousBroker.it  This test is no t yet approved or cleared by the [place]  FDA and  has been authorized for detection and/or diagnosis of SARS-CoV-2 by FDA under an Emergency Use Authorization (EUA). This EUA will remain  in effect (meaning this test can be used) for the duration of the COVID-19 declaration under Section 564(b)(1) of the Act, 21 U.S.C.section 360bbb-3(b)(1), unless the authorization is terminated  or revoked sooner.       Influenza A by PCR NEGATIVE NEGATIVE Final   Influenza B by PCR NEGATIVE NEGATIVE Final    Comment: (NOTE) The Xpert Xpress SARS-CoV-2/FLU/RSV plus assay is  intended as an aid in the diagnosis of influenza from Nasopharyngeal swab specimens and should not be used as a sole basis for treatment. Nasal washings and aspirates are unacceptable for Xpert Xpress SARS-CoV-2/FLU/RSV testing.  Fact Sheet for Patients: BloggerCourse.com  Fact Sheet for Healthcare Providers: SeriousBroker.it  This test is not yet approved or cleared by the [place]  FDA and has been authorized for detection and/or diagnosis of SARS-CoV-2 by FDA under an Emergency Use Authorization (EUA). This EUA will remain in effect (meaning this test can be used) for the duration of the COVID-19 declaration under Section 564(b)(1) of the Act, 21 U.S.C. section 360bbb-3(b)(1), unless the authorization is terminated or revoked.     Resp Syncytial Virus by PCR NEGATIVE NEGATIVE Final    Comment: (NOTE) Fact Sheet for Patients: BloggerCourse.com  Fact Sheet for Healthcare Providers: SeriousBroker.it  This test is not yet approved or cleared by the [place]  FDA and has been authorized for detection and/or diagnosis of SARS-CoV-2 by FDA under an Emergency Use Authorization (EUA). This EUA will remain in effect (meaning this test can be used) for the duration of the COVID-19 declaration under Section 564(b)(1) of the Act, 21 U.S.C. section 360bbb-3(b)(1), unless the authorization is terminated or revoked.  Performed at [facility], [address] [place], [place] [address]     Coagulation Studies: No results for input(s): LABPROT, INR in the last 72 hours.  Urinalysis: Recent Labs    01/16/24 2210  COLORURINE YELLOW*  LABSPEC 1.016  PHURINE 5.0  GLUCOSEU NEGATIVE  HGBUR LARGE*  BILIRUBINUR NEGATIVE  KETONESUR NEGATIVE  PROTEINUR 100*  NITRITE NEGATIVE  LEUKOCYTESUR NEGATIVE      Imaging: No results found.   Assessment & Plan: Mr.  KENDRA WOOLFORD is a 73 y.o.  male with past medical history of hypertension, pain syndrome, DDD, COPD, RLS, hyperlipidemia, who was admitted to [facility] on 01/16/2024 for Acute on chronic kidney failure [N17.9, N18.9] Generalized weakness [R53.1] AKI (acute kidney injury) [N17.9]  Acute kidney injury on chronic kidney disease stage IIIb.  Baseline creatinine appears to be 2.12 on 09/27/2023.  Renal ultrasound shows atrophic right kidney without obstruction.  No IV contrast exposure.  Acute kidney injury likely secondary to dehydration.  Was receiving IV hydration however discontinued due to increased edema in upper extremities.  Patient tolerating oral intake since admission.  Patient encouraged to continue this with protein supplementation.  No acute indication for dialysis.  Will continue to monitor during this admission.  Patient will need follow-up in our office at discharge.  2.  Acute metabolic acidosis, serum bicarb 17 on admission. Likely secondary to kidney failure.  Is slowly improving, 20 today.  Will continue to monitor  3. Secondary Hyperparathyroidism: with outpatient labs: No recent:  Lab Results  Component Value Date   CALCIUM  8.3 (L) 01/19/2024   PHOS 3.8 01/17/2024    Calcium  and phosphorus within desired range for renal patient.  Albumin decreased, 2.4.  Will order PTH for a.m.  Will continue to monitor bone minerals.  Continue order protein supplementation.  4. Anemia of chronic kidney disease Lab Results  Component Value Date   HGB 9.8 (L) 01/19/2024    Hemoglobin acceptable for renal patient.  Will continue to monitor for need of ESA's.  LOS: 2 Shawnetta Lein 10/15/20253:26 PM

## 2024-01-20 DIAGNOSIS — Z9189 Other specified personal risk factors, not elsewhere classified: Secondary | ICD-10-CM | POA: Diagnosis not present

## 2024-01-20 DIAGNOSIS — R7989 Other specified abnormal findings of blood chemistry: Secondary | ICD-10-CM

## 2024-01-20 DIAGNOSIS — G894 Chronic pain syndrome: Secondary | ICD-10-CM

## 2024-01-20 DIAGNOSIS — Z79899 Other long term (current) drug therapy: Secondary | ICD-10-CM | POA: Diagnosis not present

## 2024-01-20 DIAGNOSIS — M51369 Other intervertebral disc degeneration, lumbar region without mention of lumbar back pain or lower extremity pain: Secondary | ICD-10-CM

## 2024-01-20 DIAGNOSIS — Z720 Tobacco use: Secondary | ICD-10-CM

## 2024-01-20 DIAGNOSIS — N179 Acute kidney failure, unspecified: Secondary | ICD-10-CM | POA: Diagnosis not present

## 2024-01-20 DIAGNOSIS — M069 Rheumatoid arthritis, unspecified: Secondary | ICD-10-CM

## 2024-01-20 LAB — COMPREHENSIVE METABOLIC PANEL WITH GFR
ALT: 82 U/L — ABNORMAL HIGH (ref 0–44)
AST: 119 U/L — ABNORMAL HIGH (ref 15–41)
Albumin: 2.2 g/dL — ABNORMAL LOW (ref 3.5–5.0)
Alkaline Phosphatase: 45 U/L (ref 38–126)
Anion gap: 11 (ref 5–15)
BUN: 59 mg/dL — ABNORMAL HIGH (ref 8–23)
CO2: 21 mmol/L — ABNORMAL LOW (ref 22–32)
Calcium: 8.4 mg/dL — ABNORMAL LOW (ref 8.9–10.3)
Chloride: 106 mmol/L (ref 98–111)
Creatinine, Ser: 4.56 mg/dL — ABNORMAL HIGH (ref 0.61–1.24)
GFR, Estimated: 13 mL/min — ABNORMAL LOW (ref >60)
Glucose, Bld: 96 mg/dL (ref 70–99)
Potassium: 3.8 mmol/L (ref 3.5–5.1)
Sodium: 138 mmol/L (ref 135–145)
Total Bilirubin: 0.7 mg/dL (ref 0.0–1.2)
Total Protein: 5.9 g/dL — ABNORMAL LOW (ref 6.5–8.1)

## 2024-01-20 MED ORDER — SODIUM CHLORIDE 0.9 % IV SOLN: INTRAVENOUS | Status: AC

## 2024-01-20 MED ORDER — SODIUM CHLORIDE 0.9% FLUSH
10.0000 mL | Freq: Two times a day (BID) | INTRAVENOUS | Status: DC
  Administered 2024-01-20: 10 mL
  Administered 2024-01-21: 20 mL
  Administered 2024-01-21: 10 mL
  Administered 2024-01-22: 20 mL

## 2024-01-20 MED ORDER — SODIUM BICARBONATE 650 MG PO TABS
650.0000 mg | ORAL_TABLET | Freq: Two times a day (BID) | ORAL | Status: DC
  Administered 2024-01-20 (×2): 650 mg via ORAL
  Filled 2024-01-20 (×2): qty 1

## 2024-01-20 MED ORDER — SODIUM CHLORIDE 0.9% FLUSH: 10.0000 mL | INTRAVENOUS | Status: DC | PRN

## 2024-01-20 NOTE — Progress Notes (Signed)

## 2024-01-20 NOTE — Plan of Care (Signed)

## 2024-01-20 NOTE — Progress Notes (Signed)
 Mobility Specialist - Progress Note   01/20/24 1116  Mobility  Activity Ambulated with assistance;Respositioned in chair  Level of Assistance Contact guard assist, steadying assist  Assistive Device Front wheel walker  Distance Ambulated (ft) 120 ft  Range of Motion/Exercises All extremities  Activity Response Tolerated well  Mobility visit 1 Mobility  Mobility Specialist Start Time (ACUTE ONLY) 1106  Mobility Specialist Stop Time (ACUTE ONLY) 1113  Mobility Specialist Time Calculation (min) (ACUTE ONLY) 7 min     Pt was on recliner upon entry. Pt agreed to mobility. Pt was able to perform STS with 2 WW. Pt ambulated well. A recovery break to return to check O2. O2 was above 95% and pt felt fine. After activity pt returned to the room pleased of progress. Pt is in recliner with needs in reach and chair alarm on.  Clem Rodes Mobility Specialist 01/20/24, 11:21 AM

## 2024-01-20 NOTE — Progress Notes (Signed)
 Mobility Specialist - Progress Note   01/20/24 1000  Mobility  Activity Ambulated with assistance;Stood at bedside;Respositioned in chair  Level of Assistance Contact guard assist, steadying assist  Assistive Device Front wheel walker  Distance Ambulated (ft) 55 ft  Range of Motion/Exercises All extremities  Activity Response Tolerated well  Mobility visit 1 Mobility  Mobility Specialist Start Time (ACUTE ONLY) 1015  Mobility Specialist Stop Time (ACUTE ONLY) 1023  Mobility Specialist Time Calculation (min) (ACUTE ONLY) 8 min   Pt was in bed in chair position with guest in room. Pt agreed to mobility. O2 was taken as a precaution. Pt is able to get to EOB independently with no AD. Pt is able to STS with 2 WW. Pt ambulated well. Recovery break was taken to check O2. O2 was above 95%. After activity pt returned to room in recliner with needs and chair alarm on.  Clem Rodes Mobility Specialist 01/20/24, 10:29 AM

## 2024-01-20 NOTE — Progress Notes (Signed)
 Progress Note    [name]  FMW:[id number] DOB: [birth year]  DOA: 01/16/2024 PCP: Ostwalt, Janna, PA-C      Brief Narrative:    Medical records reviewed and are as summarized below:  Jerry Galloway is a 73 y.o. male with medical history significant for hypertension, CKD stage IV, chronic pain syndrome on chronic opioids for pain, history of right renal mass, restless leg syndrome, hyperlipidemia, who presented to the hospital with intractable nausea, vomiting and general weakness.   Initial vitals in the ED: Temperature 97.5 F, respiratory 16, pulse 99, BP 125/60, O2 sat 95% on room air.  Labs significant for BUN of 78, creatinine 5.92, bicarb 17, AST 273, ALT 124, GFR 9, WBC 14.1, hemoglobin 10.6  10/16: Hemodynamically stable, slowly improving renal function with creatinine now at 4.56 but still above baseline, nephrology would like to continue with gentle IV fluid for another day.  Assessment/Plan:   Principal Problem:   Acute on chronic kidney failure Active Problems:   DDD (degenerative disc disease), lumbar   History of drug overdose (02/18/2020)   Long-term current use of benzodiazepine   Dyslipidemia   Essential hypertension   Rheumatoid arthritis (HCC)   Chronic pain syndrome   CKD (chronic kidney disease), stage IV (HCC)   Tobacco use   COPD (chronic obstructive pulmonary disease) (HCC)   LFT elevation   Body mass index is 24.19 kg/m.   AKI on CKD stage IV versus CKD stage V, normal anion gap metabolic acidosis: Creatinine is slowly trending down.  Creatinine down from 5.92-5.71-4.98-4.68-4.56.  It appears it is still not back baseline. BUN and creatinine were 14 and 2.12 respectively on 09/27/2023 Nephrology is on board and would like to continue gentle IV fluid for another day  Nausea and vomiting: Improved.  Antiemetics as needed.   Mild hypokalemia: Resolved   Elevated liver enzymes: Etiology unclear.  Liver enzymes continue to  trend downward.  Monitor closely.   COPD: Stable.   Comorbidities include hypertension, hyperlipidemia, tobacco use disorder, chronic pain syndrome, anemia of chronic disease   Diet Order             Diet Heart Fluid consistency: Thin  Diet effective now                  Consultants: Nephrologist  Procedures: None  Medications:    amLODipine   5 mg Oral Daily   heparin  5,000 Units Subcutaneous Q8H   nicotine   21 mg Transdermal Daily   sodium bicarbonate   650 mg Oral BID   sodium chloride  flush  10-40 mL Intracatheter Q12H   Continuous Infusions:  sodium chloride  50 mL/hr at 01/20/24 1628    Anti-infectives (From admission, onward)    None       Family Communication/Anticipated D/C date and plan/Code Status   DVT prophylaxis: heparin injection 5,000 Units Start: 01/17/24 0600     Code Status: Full Code  Family Communication: None Disposition Plan: Plan to discharge home   Status is: Inpatient Remains inpatient appropriate because: AKI   Subjective:   Patient was sitting in chair when seen today.  No new concern.  Objective:    Vitals:   01/20/24 0400 01/20/24 0404 01/20/24 0825 01/20/24 1526  BP: (!) 107/58  109/61 109/72  Pulse: 77  80 82  Resp: 20  19 19   Temp: 97.6 F (36.4 C)  97.9 F (36.6 C) 97.9 F (36.6 C)  TempSrc:    Oral  SpO2:  98%  96% 96%  Weight:  80.9 kg    Height:       No data found.   Intake/Output Summary (Last 24 hours) at 01/20/2024 1713 Last data filed at 01/20/2024 1416 Gross per 24 hour  Intake 300 ml  Output 1425 ml  Net -1125 ml   Filed Weights   01/17/24 0500 01/19/24 0500 01/20/24 0404  Weight: 78.1 kg 81.3 kg 80.9 kg    Exam:  General.  Frail elderly man, in no acute distress. Pulmonary.  Lungs clear bilaterally, normal respiratory effort. CV.  Regular rate and rhythm, no JVD, rub or murmur. Abdomen.  Soft, nontender, nondistended, BS positive. CNS.  Alert and oriented .  No focal  neurologic deficit. Extremities.  Trace LE edema,  pulses intact and symmetrical. Bilateral upper extremity edema Psychiatry.  Judgment and insight appears normal.   Data Reviewed:   I have personally reviewed following labs and imaging studies:  Labs: Labs show the following:   Basic Metabolic Panel: Recent Labs  Lab 01/16/24 2210 01/17/24 0406 01/18/24 0350 01/19/24 0618 01/20/24 0414  NA 135 136 137 137 138  K 3.9 3.6 3.7 3.4* 3.8  CL 105 107 108 105 106  CO2 17* 19* 20* 20* 21*  GLUCOSE 127* 118* 107* 124* 96  BUN 78* 76* 64* 57* 59*  CREATININE 5.92* 5.71* 4.98* 4.68* 4.56*  CALCIUM  8.3* 8.2* 8.1* 8.3* 8.4*  PHOS  --  3.8  --   --   --    GFR Estimated Creatinine Clearance: 15.4 mL/min (A) (by C-G formula based on SCr of 4.56 mg/dL (H)). Liver Function Tests: Recent Labs  Lab 01/16/24 2210 01/17/24 0406 01/17/24 0805 01/18/24 0350 01/19/24 0618 01/20/24 0414  AST 273*  --  222* 199* 143* 119*  ALT 124*  --  98* 96* 87* 82*  ALKPHOS 60  --  44 46 43 45  BILITOT 0.7  --  0.5 0.6 0.7 0.7  PROT 7.0  --  5.4* 5.6* 5.9* 5.9*  ALBUMIN 2.9* 2.5* 2.1* 2.1* 2.4* 2.2*   No results for input(s): LIPASE, AMYLASE in the last 168 hours. No results for input(s): AMMONIA in the last 168 hours. Coagulation profile No results for input(s): INR, PROTIME in the last 168 hours.  CBC: Recent Labs  Lab 01/16/24 2210 01/17/24 0406 01/18/24 0350 01/19/24 0618  WBC 14.1* 12.0* 12.2* 8.9  HGB 10.6* 9.8* 9.5* 9.8*  HCT 31.4* 29.8* 28.8* 30.3*  MCV 94.6 96.8 96.3 97.7  PLT 378 331 295 311   Cardiac Enzymes: No results for input(s): CKTOTAL, CKMB, CKMBINDEX, TROPONINI in the last 168 hours. BNP (last 3 results) No results for input(s): PROBNP in the last 8760 hours. CBG: No results for input(s): GLUCAP in the last 168 hours. D-Dimer: No results for input(s): DDIMER in the last 72 hours. Hgb A1c: No results for input(s): HGBA1C in the last 72  hours. Lipid Profile: No results for input(s): CHOL, HDL, LDLCALC, TRIG, CHOLHDL, LDLDIRECT in the last 72 hours. Thyroid function studies: No results for input(s): TSH, T4TOTAL, T3FREE, THYROIDAB in the last 72 hours.  Invalid input(s): FREET3 Anemia work up: No results for input(s): VITAMINB12, FOLATE, FERRITIN, TIBC, IRON, RETICCTPCT in the last 72 hours. Sepsis Labs: Recent Labs  Lab 01/16/24 2210 01/17/24 0406 01/18/24 0350 01/19/24 0618  WBC 14.1* 12.0* 12.2* 8.9    Microbiology Recent Results (from the past 240 hours)  Resp panel by RT-PCR (RSV, Flu A&B, Covid) Anterior Nasal Swab  Status: None   Collection Time: 01/16/24 11:32 PM   Specimen: Anterior Nasal Swab  Result Value Ref Range Status   SARS Coronavirus 2 by RT PCR NEGATIVE NEGATIVE Final    Comment: (NOTE) SARS-CoV-2 target nucleic acids are NOT DETECTED.  The SARS-CoV-2 RNA is generally detectable in upper respiratory specimens during the acute phase of infection. The lowest concentration of SARS-CoV-2 viral copies this assay can detect is 138 copies/mL. A negative result does not preclude SARS-Cov-2 infection and should not be used as the sole basis for treatment or other patient management decisions. A negative result may occur with  improper specimen collection/handling, submission of specimen other than nasopharyngeal swab, presence of viral mutation(s) within the areas targeted by this assay, and inadequate number of viral copies(<138 copies/mL). A negative result must be combined with clinical observations, patient history, and epidemiological information. The expected result is Negative.  Fact Sheet for Patients:  BloggerCourse.com  Fact Sheet for Healthcare Providers:  SeriousBroker.it  This test is no t yet approved or cleared by the [place]  FDA and  has been authorized for detection and/or diagnosis  of SARS-CoV-2 by FDA under an Emergency Use Authorization (EUA). This EUA will remain  in effect (meaning this test can be used) for the duration of the COVID-19 declaration under Section 564(b)(1) of the Act, 21 U.S.C.section 360bbb-3(b)(1), unless the authorization is terminated  or revoked sooner.       Influenza A by PCR NEGATIVE NEGATIVE Final   Influenza B by PCR NEGATIVE NEGATIVE Final    Comment: (NOTE) The Xpert Xpress SARS-CoV-2/FLU/RSV plus assay is intended as an aid in the diagnosis of influenza from Nasopharyngeal swab specimens and should not be used as a sole basis for treatment. Nasal washings and aspirates are unacceptable for Xpert Xpress SARS-CoV-2/FLU/RSV testing.  Fact Sheet for Patients: BloggerCourse.com  Fact Sheet for Healthcare Providers: SeriousBroker.it  This test is not yet approved or cleared by the [place]  FDA and has been authorized for detection and/or diagnosis of SARS-CoV-2 by FDA under an Emergency Use Authorization (EUA). This EUA will remain in effect (meaning this test can be used) for the duration of the COVID-19 declaration under Section 564(b)(1) of the Act, 21 U.S.C. section 360bbb-3(b)(1), unless the authorization is terminated or revoked.     Resp Syncytial Virus by PCR NEGATIVE NEGATIVE Final    Comment: (NOTE) Fact Sheet for Patients: BloggerCourse.com  Fact Sheet for Healthcare Providers: SeriousBroker.it  This test is not yet approved or cleared by the [place]  FDA and has been authorized for detection and/or diagnosis of SARS-CoV-2 by FDA under an Emergency Use Authorization (EUA). This EUA will remain in effect (meaning this test can be used) for the duration of the COVID-19 declaration under Section 564(b)(1) of the Act, 21 U.S.C. section 360bbb-3(b)(1), unless the authorization is terminated  or revoked.  Performed at [facility], [address] [place], [place] [address]     Procedures and diagnostic studies:  No results found.  This record has been created using Conservation officer, historic buildings. Errors have been sought and corrected,but may not always be located. Such creation errors do not reflect on the standard of care.     LOS: 3 days   Amaryllis Dare, MD  [facility]   Pager on [email or web address]. If 7PM-7AM, please contact night-coverage at [email or web address]  01/20/2024, 5:13 PM

## 2024-01-20 NOTE — Progress Notes (Signed)
 [facility]  ROUNDING NOTE   Subjective:   Patient sitting up in bed Alert, hard of hearing No family at bedside Eating breakfast Room air  Creatinine 4.56 Urine output 1200 ml  Objective:  Vital signs in last 24 hours:  Temp:  [97.6 F (36.4 C)-98.1 F (36.7 C)] 97.9 F (36.6 C) (10/16 0825) Pulse Rate:  [76-81] 80 (10/16 0825) Resp:  [18-20] 19 (10/16 0825) BP: (104-122)/(55-67) 109/61 (10/16 0825) SpO2:  [96 %-98 %] 96 % (10/16 0825) Weight:  [80.9 kg] 80.9 kg (10/16 0404)  Weight change: -0.4 kg Filed Weights   01/17/24 0500 01/19/24 0500 01/20/24 0404  Weight: 78.1 kg 81.3 kg 80.9 kg    Intake/Output: I/O last 3 completed shifts: In: 895.5 [P.O.:180; I.V.:715.5] Out: 2425 [Urine:2425]   Intake/Output this shift:  Total I/O In: 120 [P.O.:120] Out: 275 [Urine:275]  Physical Exam: General: NAD  Head: Normocephalic, atraumatic. Moist oral mucosal membranes  Eyes: Anicteric  Lungs:  Clear to auscultation, normal effort  Heart: Regular rate and rhythm  Abdomen:  Soft, nontender  Extremities:  1+ peripheral edema, 2+ BUE  Neurologic: Awake, alert, conversant  Skin: Warm,dry, no rash       Basic Metabolic Panel: Recent Labs  Lab 01/16/24 2210 01/17/24 0406 01/18/24 0350 01/19/24 0618 01/20/24 0414  NA 135 136 137 137 138  K 3.9 3.6 3.7 3.4* 3.8  CL 105 107 108 105 106  CO2 17* 19* 20* 20* 21*  GLUCOSE 127* 118* 107* 124* 96  BUN 78* 76* 64* 57* 59*  CREATININE 5.92* 5.71* 4.98* 4.68* 4.56*  CALCIUM  8.3* 8.2* 8.1* 8.3* 8.4*  PHOS  --  3.8  --   --   --     Liver Function Tests: Recent Labs  Lab 01/16/24 2210 01/17/24 0406 01/17/24 0805 01/18/24 0350 01/19/24 0618 01/20/24 0414  AST 273*  --  222* 199* 143* 119*  ALT 124*  --  98* 96* 87* 82*  ALKPHOS 60  --  44 46 43 45  BILITOT 0.7  --  0.5 0.6 0.7 0.7  PROT 7.0  --  5.4* 5.6* 5.9* 5.9*  ALBUMIN 2.9* 2.5* 2.1* 2.1* 2.4* 2.2*   No results for input(s): LIPASE,  AMYLASE in the last 168 hours. No results for input(s): AMMONIA in the last 168 hours.  CBC: Recent Labs  Lab 01/16/24 2210 01/17/24 0406 01/18/24 0350 01/19/24 0618  WBC 14.1* 12.0* 12.2* 8.9  HGB 10.6* 9.8* 9.5* 9.8*  HCT 31.4* 29.8* 28.8* 30.3*  MCV 94.6 96.8 96.3 97.7  PLT 378 331 295 311    Cardiac Enzymes: No results for input(s): CKTOTAL, CKMB, CKMBINDEX, TROPONINI in the last 168 hours.  BNP: Invalid input(s): POCBNP  CBG: No results for input(s): GLUCAP in the last 168 hours.  Microbiology: Results for orders placed or performed during the hospital encounter of 01/16/24  Resp panel by RT-PCR (RSV, Flu A&B, Covid) Anterior Nasal Swab     Status: None   Collection Time: 01/16/24 11:32 PM   Specimen: Anterior Nasal Swab  Result Value Ref Range Status   SARS Coronavirus 2 by RT PCR NEGATIVE NEGATIVE Final    Comment: (NOTE) SARS-CoV-2 target nucleic acids are NOT DETECTED.  The SARS-CoV-2 RNA is generally detectable in upper respiratory specimens during the acute phase of infection. The lowest concentration of SARS-CoV-2 viral copies this assay can detect is 138 copies/mL. A negative result does not preclude SARS-Cov-2 infection and should not be used as the sole basis  for treatment or other patient management decisions. A negative result may occur with  improper specimen collection/handling, submission of specimen other than nasopharyngeal swab, presence of viral mutation(s) within the areas targeted by this assay, and inadequate number of viral copies(<138 copies/mL). A negative result must be combined with clinical observations, patient history, and epidemiological information. The expected result is Negative.  Fact Sheet for Patients:  BloggerCourse.com  Fact Sheet for Healthcare Providers:  SeriousBroker.it  This test is no t yet approved or cleared by the [place]  FDA and  has been  authorized for detection and/or diagnosis of SARS-CoV-2 by FDA under an Emergency Use Authorization (EUA). This EUA will remain  in effect (meaning this test can be used) for the duration of the COVID-19 declaration under Section 564(b)(1) of the Act, 21 U.S.C.section 360bbb-3(b)(1), unless the authorization is terminated  or revoked sooner.       Influenza A by PCR NEGATIVE NEGATIVE Final   Influenza B by PCR NEGATIVE NEGATIVE Final    Comment: (NOTE) The Xpert Xpress SARS-CoV-2/FLU/RSV plus assay is intended as an aid in the diagnosis of influenza from Nasopharyngeal swab specimens and should not be used as a sole basis for treatment. Nasal washings and aspirates are unacceptable for Xpert Xpress SARS-CoV-2/FLU/RSV testing.  Fact Sheet for Patients: BloggerCourse.com  Fact Sheet for Healthcare Providers: SeriousBroker.it  This test is not yet approved or cleared by the [place]  FDA and has been authorized for detection and/or diagnosis of SARS-CoV-2 by FDA under an Emergency Use Authorization (EUA). This EUA will remain in effect (meaning this test can be used) for the duration of the COVID-19 declaration under Section 564(b)(1) of the Act, 21 U.S.C. section 360bbb-3(b)(1), unless the authorization is terminated or revoked.     Resp Syncytial Virus by PCR NEGATIVE NEGATIVE Final    Comment: (NOTE) Fact Sheet for Patients: BloggerCourse.com  Fact Sheet for Healthcare Providers: SeriousBroker.it  This test is not yet approved or cleared by the [place]  FDA and has been authorized for detection and/or diagnosis of SARS-CoV-2 by FDA under an Emergency Use Authorization (EUA). This EUA will remain in effect (meaning this test can be used) for the duration of the COVID-19 declaration under Section 564(b)(1) of the Act, 21 U.S.C. section 360bbb-3(b)(1), unless the  authorization is terminated or revoked.  Performed at [facility], [address] [place], [place] [address]     Coagulation Studies: No results for input(s): LABPROT, INR in the last 72 hours.  Urinalysis: No results for input(s): COLORURINE, LABSPEC, PHURINE, GLUCOSEU, HGBUR, BILIRUBINUR, KETONESUR, PROTEINUR, UROBILINOGEN, NITRITE, LEUKOCYTESUR in the last 72 hours.  Invalid input(s): APPERANCEUR    Imaging: No results found.   Medications:    sodium chloride       amLODipine   5 mg Oral Daily   heparin  5,000 Units Subcutaneous Q8H   nicotine   21 mg Transdermal Daily   sodium bicarbonate   650 mg Oral BID   acetaminophen  **OR** acetaminophen , albuterol , calcium  carbonate (dosed in mg elemental calcium ), camphor-menthol **AND** hydrOXYzine, docusate sodium, feeding supplement (NEPRO CARB STEADY), HYDROmorphone (DILAUDID) injection, ondansetron  **OR** ondansetron  (ZOFRAN ) IV, oxyCODONE -acetaminophen  **AND** oxyCODONE , sorbitol  Assessment/ Plan:  Mr. Jerry Galloway is a 73 y.o.  male with past medical history of hypertension, pain syndrome, DDD, COPD, RLS, hyperlipidemia, who was admitted to [facility] on 01/16/2024 for Acute on chronic kidney failure [N17.9, N18.9] Generalized weakness [R53.1] AKI (acute kidney injury) [N17.9]   Acute kidney injury on chronic kidney disease stage IIIb.  Baseline creatinine appears to be 2.12 on 09/27/2023.  Renal ultrasound shows atrophic right kidney without obstruction.  No IV contrast exposure.  Acute kidney injury likely secondary to dehydration.   Creatinine slightly better overnight but not much. IVF stopped due to increased edema. Will restart IVF, Normal saline 0.9% at 50ml/hr for 8 hours and reassess. No acute indication for dialysis, but if needed, patient is agreeable. Will continue to monitor closely. Will need outpatient follow up in our office.   Lab Results  Component Value Date    CREATININE 4.56 (H) 01/20/2024   CREATININE 4.68 (H) 01/19/2024   CREATININE 4.98 (H) 01/18/2024    Intake/Output Summary (Last 24 hours) at 01/20/2024 1136 Last data filed at 01/20/2024 0900 Gross per 24 hour  Intake 300 ml  Output 1125 ml  Net -825 ml    2.  Acute metabolic acidosis, serum bicarb 17 on admission. Likely secondary to kidney failure. Slowly correcting. Will continue to monitor.    3. Secondary Hyperparathyroidism: with outpatient labs: None  Lab Results  Component Value Date   CALCIUM  8.4 (L) 01/20/2024   PHOS 3.8 01/17/2024   Calcium  and phos stable. Will continue to monitor during this admission.   4. Anemia of chronic kidney disease Lab Results  Component Value Date   HGB 9.8 (L) 01/19/2024    Hgb acceptable for renal patient. No need for ESA   LOS: 3 Geneal Huebert 10/16/202511:36 AM

## 2024-01-20 NOTE — Progress Notes (Signed)
 There was a consult for placing a PIV access. Both arm were swollen + 3-4 pitting edema. Rt. Arm was worst than Lt. Arm. Talked patient's RN Elray) regarding this matter and suggested that no PIV access at this time. However, Darice PEAK already asked MD regarding this matter and nephrology wanted to give fluid continuously. Assessed Lt. Upper arm for midline. This patient's GFR was less than 35. Secure chat with NP faith Harris and she was okay to place the midline. HS [employer]

## 2024-01-21 DIAGNOSIS — Z9189 Other specified personal risk factors, not elsewhere classified: Secondary | ICD-10-CM | POA: Diagnosis not present

## 2024-01-21 DIAGNOSIS — M51369 Other intervertebral disc degeneration, lumbar region without mention of lumbar back pain or lower extremity pain: Secondary | ICD-10-CM | POA: Diagnosis not present

## 2024-01-21 DIAGNOSIS — N179 Acute kidney failure, unspecified: Secondary | ICD-10-CM | POA: Diagnosis not present

## 2024-01-21 DIAGNOSIS — Z79899 Other long term (current) drug therapy: Secondary | ICD-10-CM | POA: Diagnosis not present

## 2024-01-21 LAB — RENAL FUNCTION PANEL
Albumin: 2.1 g/dL — ABNORMAL LOW (ref 3.5–5.0)
Anion gap: 11 (ref 5–15)
BUN: 56 mg/dL — ABNORMAL HIGH (ref 8–23)
CO2: 20 mmol/L — ABNORMAL LOW (ref 22–32)
Calcium: 8.1 mg/dL — ABNORMAL LOW (ref 8.9–10.3)
Chloride: 107 mmol/L (ref 98–111)
Creatinine, Ser: 4.06 mg/dL — ABNORMAL HIGH (ref 0.61–1.24)
GFR, Estimated: 15 mL/min — ABNORMAL LOW (ref >60)
Glucose, Bld: 102 mg/dL — ABNORMAL HIGH (ref 70–99)
Phosphorus: 3.4 mg/dL (ref 2.5–4.6)
Potassium: 3.6 mmol/L (ref 3.5–5.1)
Sodium: 138 mmol/L (ref 135–145)

## 2024-01-21 MED ORDER — SODIUM BICARBONATE 650 MG PO TABS
1300.0000 mg | ORAL_TABLET | Freq: Two times a day (BID) | ORAL | Status: DC
  Administered 2024-01-21 – 2024-01-22 (×3): 1300 mg via ORAL
  Filled 2024-01-21 (×3): qty 2

## 2024-01-21 NOTE — Progress Notes (Signed)
   01/21/24 1600  Mobility  Activity Ambulated with assistance  Level of Assistance Contact guard assist, steadying assist  Assistive Device Front wheel walker  Distance Ambulated (ft) 180 ft  Range of Motion/Exercises Active Assistive  Activity Response Tolerated well  Mobility visit 1 Mobility  Mobility Specialist Start Time (ACUTE ONLY) 1546  Mobility Specialist Stop Time (ACUTE ONLY) 1556  Mobility Specialist Time Calculation (min) (ACUTE ONLY) 10 min   Pt was at EOB upon entry. Pt agreed to mobility. Pt is able to EOB independently with no AD. Pt is able to STS with 2WW. Pt ambulated well with 2 [employer]. After activity pt returned to the bed with needs in reach. Jerry Galloway Mobility Specialist 01/21/24, 4:18 PM

## 2024-01-21 NOTE — Plan of Care (Signed)

## 2024-01-21 NOTE — Progress Notes (Addendum)
 [facility]  ROUNDING NOTE   Subjective:   Patient seen sitting up in bed Partially completed breakfast tray at bedside Room air, continues to have shortness of breath on exertion Upper extremity edema improving  Creatinine 4.06 Urine output 1275 ml  Objective:  Vital signs in last 24 hours:  Temp:  [97.4 F (36.3 C)-97.9 F (36.6 C)] 97.4 F (36.3 C) (10/17 0753) Pulse Rate:  [74-84] 77 (10/17 0753) Resp:  [15-19] 15 (10/17 0753) BP: (101-109)/(53-72) 101/54 (10/17 0753) SpO2:  [95 %-97 %] 95 % (10/17 0753) Weight:  [81 kg] 81 kg (10/17 0327)  Weight change: 0.1 kg Filed Weights   01/19/24 0500 01/20/24 0404 01/21/24 0327  Weight: 81.3 kg 80.9 kg 81 kg    Intake/Output: I/O last 3 completed shifts: In: 794.9 [P.O.:120; I.V.:674.9] Out: 1850 [Urine:1850]   Intake/Output this shift:  No intake/output data recorded.  Physical Exam: General: NAD  Head: Normocephalic, atraumatic. Moist oral mucosal membranes  Eyes: Anicteric  Lungs:  Clear to auscultation, normal effort  Heart: Regular rate and rhythm  Abdomen:  Soft, nontender  Extremities:  1+ peripheral edema, 2+ BUE  Neurologic: Awake, alert, conversant  Skin: Warm,dry, no rash       Basic Metabolic Panel: Recent Labs  Lab 01/17/24 0406 01/18/24 0350 01/19/24 0618 01/20/24 0414 01/21/24 0502  NA 136 137 137 138 138  K 3.6 3.7 3.4* 3.8 3.6  CL 107 108 105 106 107  CO2 19* 20* 20* 21* 20*  GLUCOSE 118* 107* 124* 96 102*  BUN 76* 64* 57* 59* 56*  CREATININE 5.71* 4.98* 4.68* 4.56* 4.06*  CALCIUM  8.2* 8.1* 8.3* 8.4* 8.1*  PHOS 3.8  --   --   --  3.4    Liver Function Tests: Recent Labs  Lab 01/16/24 2210 01/17/24 0406 01/17/24 0805 01/18/24 0350 01/19/24 0618 01/20/24 0414 01/21/24 0502  AST 273*  --  222* 199* 143* 119*  --   ALT 124*  --  98* 96* 87* 82*  --   ALKPHOS 60  --  44 46 43 45  --   BILITOT 0.7  --  0.5 0.6 0.7 0.7  --   PROT 7.0  --  5.4* 5.6* 5.9* 5.9*  --    ALBUMIN 2.9*   < > 2.1* 2.1* 2.4* 2.2* 2.1*   < > = values in this interval not displayed.   No results for input(s): LIPASE, AMYLASE in the last 168 hours. No results for input(s): AMMONIA in the last 168 hours.  CBC: Recent Labs  Lab 01/16/24 2210 01/17/24 0406 01/18/24 0350 01/19/24 0618  WBC 14.1* 12.0* 12.2* 8.9  HGB 10.6* 9.8* 9.5* 9.8*  HCT 31.4* 29.8* 28.8* 30.3*  MCV 94.6 96.8 96.3 97.7  PLT 378 331 295 311    Cardiac Enzymes: No results for input(s): CKTOTAL, CKMB, CKMBINDEX, TROPONINI in the last 168 hours.  BNP: Invalid input(s): POCBNP  CBG: No results for input(s): GLUCAP in the last 168 hours.  Microbiology: Results for orders placed or performed during the hospital encounter of 01/16/24  Resp panel by RT-PCR (RSV, Flu A&B, Covid) Anterior Nasal Swab     Status: None   Collection Time: 01/16/24 11:32 PM   Specimen: Anterior Nasal Swab  Result Value Ref Range Status   SARS Coronavirus 2 by RT PCR NEGATIVE NEGATIVE Final    Comment: (NOTE) SARS-CoV-2 target nucleic acids are NOT DETECTED.  The SARS-CoV-2 RNA is generally detectable in upper respiratory specimens during the  acute phase of infection. The lowest concentration of SARS-CoV-2 viral copies this assay can detect is 138 copies/mL. A negative result does not preclude SARS-Cov-2 infection and should not be used as the sole basis for treatment or other patient management decisions. A negative result may occur with  improper specimen collection/handling, submission of specimen other than nasopharyngeal swab, presence of viral mutation(s) within the areas targeted by this assay, and inadequate number of viral copies(<138 copies/mL). A negative result must be combined with clinical observations, patient history, and epidemiological information. The expected result is Negative.  Fact Sheet for Patients:  BloggerCourse.com  Fact Sheet for Healthcare  Providers:  SeriousBroker.it  This test is no t yet approved or cleared by the [place]  FDA and  has been authorized for detection and/or diagnosis of SARS-CoV-2 by FDA under an Emergency Use Authorization (EUA). This EUA will remain  in effect (meaning this test can be used) for the duration of the COVID-19 declaration under Section 564(b)(1) of the Act, 21 U.S.C.section 360bbb-3(b)(1), unless the authorization is terminated  or revoked sooner.       Influenza A by PCR NEGATIVE NEGATIVE Final   Influenza B by PCR NEGATIVE NEGATIVE Final    Comment: (NOTE) The Xpert Xpress SARS-CoV-2/FLU/RSV plus assay is intended as an aid in the diagnosis of influenza from Nasopharyngeal swab specimens and should not be used as a sole basis for treatment. Nasal washings and aspirates are unacceptable for Xpert Xpress SARS-CoV-2/FLU/RSV testing.  Fact Sheet for Patients: BloggerCourse.com  Fact Sheet for Healthcare Providers: SeriousBroker.it  This test is not yet approved or cleared by the [place]  FDA and has been authorized for detection and/or diagnosis of SARS-CoV-2 by FDA under an Emergency Use Authorization (EUA). This EUA will remain in effect (meaning this test can be used) for the duration of the COVID-19 declaration under Section 564(b)(1) of the Act, 21 U.S.C. section 360bbb-3(b)(1), unless the authorization is terminated or revoked.     Resp Syncytial Virus by PCR NEGATIVE NEGATIVE Final    Comment: (NOTE) Fact Sheet for Patients: BloggerCourse.com  Fact Sheet for Healthcare Providers: SeriousBroker.it  This test is not yet approved or cleared by the [place]  FDA and has been authorized for detection and/or diagnosis of SARS-CoV-2 by FDA under an Emergency Use Authorization (EUA). This EUA will remain in effect (meaning this test can be  used) for the duration of the COVID-19 declaration under Section 564(b)(1) of the Act, 21 U.S.C. section 360bbb-3(b)(1), unless the authorization is terminated or revoked.  Performed at [facility], [address] [place], [place] [address]     Coagulation Studies: No results for input(s): LABPROT, INR in the last 72 hours.  Urinalysis: No results for input(s): COLORURINE, LABSPEC, PHURINE, GLUCOSEU, HGBUR, BILIRUBINUR, KETONESUR, PROTEINUR, UROBILINOGEN, NITRITE, LEUKOCYTESUR in the last 72 hours.  Invalid input(s): APPERANCEUR    Imaging: No results found.   Medications:      amLODipine   5 mg Oral Daily   heparin  5,000 Units Subcutaneous Q8H   nicotine   21 mg Transdermal Daily   sodium bicarbonate   1,300 mg Oral BID   sodium chloride  flush  10-40 mL Intracatheter Q12H   acetaminophen  **OR** acetaminophen , albuterol , calcium  carbonate (dosed in mg elemental calcium ), camphor-menthol **AND** hydrOXYzine, docusate sodium, feeding supplement (NEPRO CARB STEADY), HYDROmorphone (DILAUDID) injection, ondansetron  **OR** ondansetron  (ZOFRAN ) IV, oxyCODONE -acetaminophen  **AND** oxyCODONE , sodium chloride  flush, sorbitol  Assessment/ Plan:  Mr. Jerry Galloway is a 73 y.o.  male with past medical history  of hypertension, pain syndrome, DDD, COPD, RLS, hyperlipidemia, who was admitted to [facility] on 01/16/2024 for Acute on chronic kidney failure [N17.9, N18.9] Generalized weakness [R53.1] AKI (acute kidney injury) [N17.9]   Acute kidney injury on chronic kidney disease stage IIIb.  Baseline creatinine appears to be 2.12 on 09/27/2023.  Renal ultrasound shows atrophic right kidney without obstruction.  No IV contrast exposure.  Acute kidney injury likely secondary to dehydration.   Creatinine has responded appropriately to IV fluids, 4.06 today.  Adequate urine output also noted.  Will continue gentle IV hydration and monitor for any signs of  increased volume or shortness of breath.  No acute indication for dialysis. Would prefer creatinine closer to baseline before discharge.   Lab Results  Component Value Date   CREATININE 4.06 (H) 01/21/2024   CREATININE 4.56 (H) 01/20/2024   CREATININE 4.68 (H) 01/19/2024    Intake/Output Summary (Last 24 hours) at 01/21/2024 1018 Last data filed at 01/21/2024 0558 Gross per 24 hour  Intake 674.94 ml  Output 1000 ml  Net -325.06 ml    2.  Acute metabolic acidosis, serum bicarb 17 on admission. Likely secondary to kidney failure.  Mildly decreased but stable.  Continue oral supplementation.   3. Secondary Hyperparathyroidism: with outpatient labs: None  Lab Results  Component Value Date   CALCIUM  8.1 (L) 01/21/2024   PHOS 3.4 01/21/2024  Will continue to monitor bone minerals during this admission.  4. Anemia of chronic kidney disease Lab Results  Component Value Date   HGB 9.8 (L) 01/19/2024    Hemoglobin remained stable.  No need for ESA   LOS: 4 Jerry Galloway 10/17/202510:18 AM

## 2024-01-21 NOTE — Progress Notes (Signed)
 Mobility Specialist - Progress Note   01/21/24 1126  Mobility  Activity Ambulated with assistance  Level of Assistance Standby assist, set-up cues, supervision of patient - no hands on  Assistive Device Front wheel walker  Distance Ambulated (ft) 24 ft  Activity Response Tolerated well  Mobility visit 1 Mobility  Mobility Specialist Start Time (ACUTE ONLY) 1113  Mobility Specialist Stop Time (ACUTE ONLY) 1122  Mobility Specialist Time Calculation (min) (ACUTE ONLY) 9 min   Pt sitting EOB upon entry, utilizing RA. Pt amb to/from the bathroom w/ sup-- tolerated well. Pt left seated EOB with needs within reach. Family present at bedside.  America Silvan Mobility Specialist 01/21/24 11:29 AM

## 2024-01-21 NOTE — Progress Notes (Signed)
 Progress Note    Jerry Galloway  FMW:[id number] DOB: Dec 02, 1950  DOA: 01/16/2024 PCP: Ostwalt, Janna, PA-C      Brief Narrative:    Medical records reviewed and are as summarized below:  Jerry Galloway is a 73 y.o. male with medical history significant for hypertension, CKD stage IV, chronic pain syndrome on chronic opioids for pain, history of right renal mass, restless leg syndrome, hyperlipidemia, who presented to the hospital with intractable nausea, vomiting and general weakness.   Initial vitals in the ED: Temperature 97.5 F, respiratory 16, pulse 99, BP 125/60, O2 sat 95% on room air.  Labs significant for BUN of 78, creatinine 5.92, bicarb 17, AST 273, ALT 124, GFR 9, WBC 14.1, hemoglobin 10.6  10/16: Hemodynamically stable, slowly improving renal function with creatinine now at 4.56 but still above baseline, nephrology would like to continue with gentle IV fluid for another day.  10/17: Hemodynamically stable, slowly improving renal function with creatinine now at 4.06, worsening edema involving extremities, nephrology would like to continue with gentle IV fluid.  Assessment/Plan:   Principal Problem:   Acute on chronic kidney failure Active Problems:   DDD (degenerative disc disease), lumbar   History of drug overdose (02/18/2020)   Long-term current use of benzodiazepine   Dyslipidemia   Essential hypertension   Rheumatoid arthritis (HCC)   Chronic pain syndrome   CKD (chronic kidney disease), stage IV (HCC)   Tobacco use   COPD (chronic obstructive pulmonary disease) (HCC)   LFT elevation   Body mass index is 24.22 kg/m.   AKI on CKD stage IV versus CKD stage V, normal anion gap metabolic acidosis: Creatinine is slowly trending down.  Creatinine down from 5.92-5.71-4.98-4.68-4.56-4.06.  It appears it is still not back baseline. BUN and creatinine were 14 and 2.12 respectively on 09/27/2023 Nephrology is on board and would like to continue  gentle IV fluid for another day  Nausea and vomiting: Improved.  Antiemetics as needed.   Mild hypokalemia: Resolved   Elevated liver enzymes: Etiology unclear.  Liver enzymes continue to trend downward.  Monitor closely.   COPD: Stable.   Comorbidities include hypertension, hyperlipidemia, tobacco use disorder, chronic pain syndrome, anemia of chronic disease   Diet Order             Diet Heart Fluid consistency: Thin  Diet effective now                  Consultants: Nephrologist  Procedures: None  Medications:    amLODipine   5 mg Oral Daily   heparin  5,000 Units Subcutaneous Q8H   nicotine   21 mg Transdermal Daily   sodium bicarbonate   1,300 mg Oral BID   sodium chloride  flush  10-40 mL Intracatheter Q12H   Continuous Infusions:    Anti-infectives (From admission, onward)    None       Family Communication/Anticipated D/C date and plan/Code Status   DVT prophylaxis: heparin injection 5,000 Units Start: 01/17/24 0600     Code Status: Full Code  Family Communication: Discussed with daughter at bedside.  Disposition Plan: Plan to discharge home   Status is: Inpatient Remains inpatient appropriate because: AKI   Subjective:  Patient was seen and examined today. worsening edema especially upper extremities.  No shortness of breath.  Objective:    Vitals:   01/21/24 0327 01/21/24 0329 01/21/24 0753 01/21/24 1421  BP:  (!) 101/53 (!) 101/54 (!) 105/56  Pulse:  74 77 83  Resp:  16 15 17   Temp:  (!) 97.5 F (36.4 C) (!) 97.4 F (36.3 C) 97.6 F (36.4 C)  TempSrc:   Oral Oral  SpO2:  97% 95% 96%  Weight: 81 kg     Height:       No data found.   Intake/Output Summary (Last 24 hours) at 01/21/2024 1552 Last data filed at 01/21/2024 1300 Gross per 24 hour  Intake 914.94 ml  Output 700 ml  Net 214.94 ml   Filed Weights   01/19/24 0500 01/20/24 0404 01/21/24 0327  Weight: 81.3 kg 80.9 kg 81 kg    Exam:  General.  Frail  elderly man, in no acute distress. Pulmonary.  Lungs clear bilaterally, normal respiratory effort. CV.  Regular rate and rhythm, no JVD, rub or murmur. Abdomen.  Soft, nontender, nondistended, BS positive. CNS.  Alert and oriented .  No focal neurologic deficit. Extremities.  1-2+ upper extremity edema, 1+ LE edema Psychiatry.  Judgment and insight appears normal.    Data Reviewed:   I have personally reviewed following labs and imaging studies:  Labs: Labs show the following:   Basic Metabolic Panel: Recent Labs  Lab 01/17/24 0406 01/18/24 0350 01/19/24 0618 01/20/24 0414 01/21/24 0502  NA 136 137 137 138 138  K 3.6 3.7 3.4* 3.8 3.6  CL 107 108 105 106 107  CO2 19* 20* 20* 21* 20*  GLUCOSE 118* 107* 124* 96 102*  BUN 76* 64* 57* 59* 56*  CREATININE 5.71* 4.98* 4.68* 4.56* 4.06*  CALCIUM  8.2* 8.1* 8.3* 8.4* 8.1*  PHOS 3.8  --   --   --  3.4   GFR Estimated Creatinine Clearance: 17.3 mL/min (A) (by C-G formula based on SCr of 4.06 mg/dL (H)). Liver Function Tests: Recent Labs  Lab 01/16/24 2210 01/17/24 0406 01/17/24 0805 01/18/24 0350 01/19/24 0618 01/20/24 0414 01/21/24 0502  AST 273*  --  222* 199* 143* 119*  --   ALT 124*  --  98* 96* 87* 82*  --   ALKPHOS 60  --  44 46 43 45  --   BILITOT 0.7  --  0.5 0.6 0.7 0.7  --   PROT 7.0  --  5.4* 5.6* 5.9* 5.9*  --   ALBUMIN 2.9*   < > 2.1* 2.1* 2.4* 2.2* 2.1*   < > = values in this interval not displayed.   No results for input(s): LIPASE, AMYLASE in the last 168 hours. No results for input(s): AMMONIA in the last 168 hours. Coagulation profile No results for input(s): INR, PROTIME in the last 168 hours.  CBC: Recent Labs  Lab 01/16/24 2210 01/17/24 0406 01/18/24 0350 01/19/24 0618  WBC 14.1* 12.0* 12.2* 8.9  HGB 10.6* 9.8* 9.5* 9.8*  HCT 31.4* 29.8* 28.8* 30.3*  MCV 94.6 96.8 96.3 97.7  PLT 378 331 295 311   Cardiac Enzymes: No results for input(s): CKTOTAL, CKMB, CKMBINDEX,  TROPONINI in the last 168 hours. BNP (last 3 results) No results for input(s): PROBNP in the last 8760 hours. CBG: No results for input(s): GLUCAP in the last 168 hours. D-Dimer: No results for input(s): DDIMER in the last 72 hours. Hgb A1c: No results for input(s): HGBA1C in the last 72 hours. Lipid Profile: No results for input(s): CHOL, HDL, LDLCALC, TRIG, CHOLHDL, LDLDIRECT in the last 72 hours. Thyroid function studies: No results for input(s): TSH, T4TOTAL, T3FREE, THYROIDAB in the last 72 hours.  Invalid input(s): FREET3 Anemia work up: No results for input(s):  VITAMINB12, FOLATE, FERRITIN, TIBC, IRON, RETICCTPCT in the last 72 hours. Sepsis Labs: Recent Labs  Lab 01/16/24 2210 01/17/24 0406 01/18/24 0350 01/19/24 0618  WBC 14.1* 12.0* 12.2* 8.9    Microbiology Recent Results (from the past 240 hours)  Resp panel by RT-PCR (RSV, Flu A&B, Covid) Anterior Nasal Swab     Status: None   Collection Time: 01/16/24 11:32 PM   Specimen: Anterior Nasal Swab  Result Value Ref Range Status   SARS Coronavirus 2 by RT PCR NEGATIVE NEGATIVE Final    Comment: (NOTE) SARS-CoV-2 target nucleic acids are NOT DETECTED.  The SARS-CoV-2 RNA is generally detectable in upper respiratory specimens during the acute phase of infection. The lowest concentration of SARS-CoV-2 viral copies this assay can detect is 138 copies/mL. A negative result does not preclude SARS-Cov-2 infection and should not be used as the sole basis for treatment or other patient management decisions. A negative result may occur with  improper specimen collection/handling, submission of specimen other than nasopharyngeal swab, presence of viral mutation(s) within the areas targeted by this assay, and inadequate number of viral copies(<138 copies/mL). A negative result must be combined with clinical observations, patient history, and epidemiological information. The  expected result is Negative.  Fact Sheet for Patients:  BloggerCourse.com  Fact Sheet for Healthcare Providers:  SeriousBroker.it  This test is no t yet approved or cleared by the [place]  FDA and  has been authorized for detection and/or diagnosis of SARS-CoV-2 by FDA under an Emergency Use Authorization (EUA). This EUA will remain  in effect (meaning this test can be used) for the duration of the COVID-19 declaration under Section 564(b)(1) of the Act, 21 U.S.C.section 360bbb-3(b)(1), unless the authorization is terminated  or revoked sooner.       Influenza A by PCR NEGATIVE NEGATIVE Final   Influenza B by PCR NEGATIVE NEGATIVE Final    Comment: (NOTE) The Xpert Xpress SARS-CoV-2/FLU/RSV plus assay is intended as an aid in the diagnosis of influenza from Nasopharyngeal swab specimens and should not be used as a sole basis for treatment. Nasal washings and aspirates are unacceptable for Xpert Xpress SARS-CoV-2/FLU/RSV testing.  Fact Sheet for Patients: BloggerCourse.com  Fact Sheet for Healthcare Providers: SeriousBroker.it  This test is not yet approved or cleared by the [place]  FDA and has been authorized for detection and/or diagnosis of SARS-CoV-2 by FDA under an Emergency Use Authorization (EUA). This EUA will remain in effect (meaning this test can be used) for the duration of the COVID-19 declaration under Section 564(b)(1) of the Act, 21 U.S.C. section 360bbb-3(b)(1), unless the authorization is terminated or revoked.     Resp Syncytial Virus by PCR NEGATIVE NEGATIVE Final    Comment: (NOTE) Fact Sheet for Patients: BloggerCourse.com  Fact Sheet for Healthcare Providers: SeriousBroker.it  This test is not yet approved or cleared by the [place]  FDA and has been authorized for detection and/or  diagnosis of SARS-CoV-2 by FDA under an Emergency Use Authorization (EUA). This EUA will remain in effect (meaning this test can be used) for the duration of the COVID-19 declaration under Section 564(b)(1) of the Act, 21 U.S.C. section 360bbb-3(b)(1), unless the authorization is terminated or revoked.  Performed at [facility], [address] [place], [place] [address]     Procedures and diagnostic studies:  No results found.  This record has been created using Conservation officer, historic buildings. Errors have been sought and corrected,but may not always be located. Such creation errors do not reflect  on the standard of care.     LOS: 4 days   Amaryllis Dare, MD  [facility]   Pager on [email or web address]. If 7PM-7AM, please contact night-coverage at [email or web address]  01/21/2024, 3:52 PM

## 2024-01-22 ENCOUNTER — Other Ambulatory Visit: Payer: Self-pay

## 2024-01-22 DIAGNOSIS — Z9189 Other specified personal risk factors, not elsewhere classified: Secondary | ICD-10-CM | POA: Diagnosis not present

## 2024-01-22 DIAGNOSIS — M51369 Other intervertebral disc degeneration, lumbar region without mention of lumbar back pain or lower extremity pain: Secondary | ICD-10-CM | POA: Diagnosis not present

## 2024-01-22 DIAGNOSIS — N184 Chronic kidney disease, stage 4 (severe): Secondary | ICD-10-CM | POA: Diagnosis not present

## 2024-01-22 DIAGNOSIS — N179 Acute kidney failure, unspecified: Secondary | ICD-10-CM | POA: Diagnosis not present

## 2024-01-22 LAB — RENAL FUNCTION PANEL
Albumin: 2.3 g/dL — ABNORMAL LOW (ref 3.5–5.0)
Anion gap: 9 (ref 5–15)
BUN: 52 mg/dL — ABNORMAL HIGH (ref 8–23)
CO2: 25 mmol/L (ref 22–32)
Calcium: 8.2 mg/dL — ABNORMAL LOW (ref 8.9–10.3)
Chloride: 105 mmol/L (ref 98–111)
Creatinine, Ser: 3.76 mg/dL — ABNORMAL HIGH (ref 0.61–1.24)
GFR, Estimated: 16 mL/min — ABNORMAL LOW (ref >60)
Glucose, Bld: 104 mg/dL — ABNORMAL HIGH (ref 70–99)
Phosphorus: 3.3 mg/dL (ref 2.5–4.6)
Potassium: 3.5 mmol/L (ref 3.5–5.1)
Sodium: 139 mmol/L (ref 135–145)

## 2024-01-22 MED ORDER — NEPRO/CARBSTEADY PO LIQD
237.0000 mL | Freq: Three times a day (TID) | ORAL | 2 refills | Status: AC | PRN
  Filled 2024-01-22: qty 20000, 28d supply, fill #0

## 2024-01-22 MED ORDER — SODIUM BICARBONATE 650 MG PO TABS
650.0000 mg | ORAL_TABLET | Freq: Every day | ORAL | 1 refills | Status: AC
  Filled 2024-01-22: qty 90, 90d supply, fill #0

## 2024-01-22 MED ORDER — SODIUM BICARBONATE 650 MG PO TABS: 650.0000 mg | ORAL_TABLET | Freq: Every day | ORAL | Status: DC

## 2024-01-22 NOTE — Plan of Care (Signed)

## 2024-01-22 NOTE — Progress Notes (Signed)
 [facility]  PROGRESS NOTE   Subjective:   Patient is sitting in bed in NAD. He ate his breakfast today. Says his breathing has significantly improved.  Objective:  Vital signs: Blood pressure (!) 103/51, pulse 71, temperature 98.6 F (37 C), resp. rate 17, height 6' (1.829 m), weight 81.4 kg, SpO2 97%.  Intake/Output Summary (Last 24 hours) at 01/22/2024 1145 Last data filed at 01/22/2024 1100 Gross per 24 hour  Intake 700 ml  Output 1885 ml  Net -1185 ml   Filed Weights   01/20/24 0404 01/21/24 0327 01/22/24 0733  Weight: 80.9 kg 81 kg 81.4 kg     Physical Exam: General:  No acute distress  Head:  Normocephalic, atraumatic. Moist oral mucosal membranes  Eyes:  Anicteric  Neck:  Supple  Lungs:   Clear to auscultation, normal effort  Heart:  S1S2 no rubs  Abdomen:   Soft, nontender, bowel sounds present  Extremities:  peripheral edema.  Neurologic:  Awake, alert, following commands  Skin:  No lesions  Access:     Basic Metabolic Panel: Recent Labs  Lab 01/17/24 0406 01/18/24 0350 01/19/24 0618 01/20/24 0414 01/21/24 0502 01/22/24 1017  NA 136 137 137 138 138 139  K 3.6 3.7 3.4* 3.8 3.6 3.5  CL 107 108 105 106 107 105  CO2 19* 20* 20* 21* 20* 25  GLUCOSE 118* 107* 124* 96 102* 104*  BUN 76* 64* 57* 59* 56* 52*  CREATININE 5.71* 4.98* 4.68* 4.56* 4.06* 3.76*  CALCIUM  8.2* 8.1* 8.3* 8.4* 8.1* 8.2*  PHOS 3.8  --   --   --  3.4 3.3   GFR: Estimated Creatinine Clearance: 18.6 mL/min (A) (by C-G formula based on SCr of 3.76 mg/dL (H)).  Liver Function Tests: Recent Labs  Lab 01/16/24 2210 01/17/24 0406 01/17/24 0805 01/18/24 0350 01/19/24 0618 01/20/24 0414 01/21/24 0502 01/22/24 1017  AST 273*  --  222* 199* 143* 119*  --   --   ALT 124*  --  98* 96* 87* 82*  --   --   ALKPHOS 60  --  44 46 43 45  --   --   BILITOT 0.7  --  0.5 0.6 0.7 0.7  --   --   PROT 7.0  --  5.4* 5.6* 5.9* 5.9*  --   --   ALBUMIN 2.9*   < > 2.1* 2.1* 2.4*  2.2* 2.1* 2.3*   < > = values in this interval not displayed.   No results for input(s): LIPASE, AMYLASE in the last 168 hours. No results for input(s): AMMONIA in the last 168 hours.  CBC: Recent Labs  Lab 01/16/24 2210 01/17/24 0406 01/18/24 0350 01/19/24 0618  WBC 14.1* 12.0* 12.2* 8.9  HGB 10.6* 9.8* 9.5* 9.8*  HCT 31.4* 29.8* 28.8* 30.3*  MCV 94.6 96.8 96.3 97.7  PLT 378 331 295 311     HbA1C: Hgb A1c MFr Bld  Date/Time Value Ref Range Status  09/27/2023 11:07 AM 5.8 (H) 4.8 - 5.6 % Final    Comment:             Prediabetes: 5.7 - 6.4          Diabetes: >6.4          Glycemic control for adults with diabetes: <7.0   09/29/2021 10:58 AM 5.7 (H) 4.8 - 5.6 % Final    Comment:             Prediabetes: 5.7 - 6.4  Diabetes: >6.4          Glycemic control for adults with diabetes: <7.0     Urinalysis: No results for input(s): COLORURINE, LABSPEC, PHURINE, GLUCOSEU, HGBUR, BILIRUBINUR, KETONESUR, PROTEINUR, UROBILINOGEN, NITRITE, LEUKOCYTESUR in the last 72 hours.  Invalid input(s): APPERANCEUR    Imaging:  Acute renal failure   EXAM: RENAL / URINARY TRACT ULTRASOUND COMPLETE   COMPARISON:  None Available.   FINDINGS: Right Kidney:   Renal measurements: 2.2 x 4.754.7 cm = volume: 116 mL. Cortical thinning. Increased echotexture. No mass or hydronephrosis.   Left Kidney:   Renal measurements: 10.4 x 5.7 x 5.7 cm = volume: 177 mL. Diffusely increased echotexture. No mass or hydronephrosis.   Bladder:   Appears normal for degree of bladder distention.   Other:   None.   IMPRESSION: No acute findings.  No hydronephrosis.   Increased echotexture compatible with chronic medical renal disease.     Electronically Signed   By: Franky Crease M.D.   On: 01/16/2024 23:30 Medications:     amLODipine   5 mg Oral Daily   heparin  5,000 Units Subcutaneous Q8H   nicotine   21 mg Transdermal Daily   [START ON  01/23/2024] sodium bicarbonate   650 mg Oral Daily   sodium chloride  flush  10-40 mL Intracatheter Q12H    Assessment/ Plan:     73 year old male with history of hypertension, coronary artery disease, COPD, restless leg syndrome, hyperlipidemia and chronic kidney disease stage IV now admitted with history of intractable nausea vomiting and weakness.  #1: Acute kidney injury on chronic kidney disease: Renal indices have improved since admission.  Renal sonogram did not show any evidence of obstruction.  #2: Metabolic acidosis: Patient had acute metabolic acidosis and is improved with sodium bicarbonate .  I would like to decrease the dose to 650 mg once a day.  #3: Hypertension: Blood pressure is improved with amlodipine  at 5 mg daily.  #4: Lower extremity edema: Lower extremity edema can be secondary to amlodipine  due to capillary leak syndrome.  Patient need to be seen by Dr. Dennise within the next week. Labs and medications reviewed. Will continue to follow along with you.   LOS: 5 Matteo Banke, MD [facility] 10/18/202511:45 AM

## 2024-01-22 NOTE — TOC Transition Note (Addendum)
 Transition of Care [facility]) - Discharge Note   Patient Details  Name: Jerry Galloway MRN: [id number] Date of Birth: [birth year]  Transition of Care [facility]) CM/SW Contact:  [place] L Sebastiano Luecke, LCSW Phone Number: 01/22/2024, 1:40 PM   Clinical Narrative:     CSW ordered DME through patient portal for home delivery. CSW contacted patients daughter to advise that insurance will not pay for a shower stool. She stated that she believes that the family already has one. CSW advised that RW will be delivered to the home.   TOC signing off.         Patient Goals and CMS Choice            Discharge Placement                       Discharge Plan and Services Additional resources added to the After Visit Summary for                                       Social Drivers of Health (SDOH) Interventions SDOH Screenings   Food Insecurity: No Food Insecurity (01/17/2024)  Housing: Low Risk  (01/17/2024)  Transportation Needs: No Transportation Needs (01/17/2024)  Utilities: Not At Risk (01/17/2024)  Depression (PHQ2-9): Low Risk  (09/27/2023)  Financial Resource Strain: Low Risk  (10/17/2021)  Physical Activity: Sufficiently Active (10/17/2021)  Social Connections: Moderately Isolated (01/17/2024)  Stress: No Stress Concern Present (10/17/2021)  Tobacco Use: High Risk (01/17/2024)     Readmission Risk Interventions     No data to display

## 2024-01-22 NOTE — Discharge Summary (Signed)
 Physician Discharge Summary   Patient: Jerry Galloway MRN: [id number] DOB: 1950/09/05  Admit date:     01/16/2024  Discharge date: 01/22/24  Discharge Physician: Amaryllis Dare   PCP: Ostwalt, Janna, PA-C   Recommendations at discharge:  Please obtain CBC and renal function within next 3 to 4 days Follow-up with nephrology by mid next week Follow-up with primary care provider  Discharge Diagnoses: Principal Problem:   Acute on chronic kidney failure Active Problems:   DDD (degenerative disc disease), lumbar   History of drug overdose (02/18/2020)   Long-term current use of benzodiazepine   Dyslipidemia   Essential hypertension   Rheumatoid arthritis (HCC)   Chronic pain syndrome   CKD (chronic kidney disease), stage IV (HCC)   Tobacco use   COPD (chronic obstructive pulmonary disease) (HCC)   LFT elevation   Hospital Course:  Jerry Galloway is a 73 y.o. male with medical history significant for hypertension, CKD stage IV, chronic pain syndrome on chronic opioids for pain, history of right renal mass, restless leg syndrome, hyperlipidemia, who presented to the hospital with intractable nausea, vomiting and general weakness.    Initial vitals in the ED: Temperature 97.5 F, respiratory 16, pulse 99, BP 125/60, O2 sat 95% on room air.   Labs significant for BUN of 78, creatinine 5.92, bicarb 17, AST 273, ALT 124, GFR 9, WBC 14.1, hemoglobin 10.6  Creatinine slowly trending down with gentle IV fluid, improved to 3.76 before discharge.  Seems to be at 2.12 in June 2025. Patient does has little worsening of her edema involving all extremities with IV fluid.  IV fluid were eventually discontinued and patient should be encouraged to keep with p.o. hydration. He was started on bicarb tablets for metabolic acidosis secondary to advanced renal disease.  Patient will continue on current medications and need to have a close follow-up with his primary care provider and  nephrologist for further assistance.  Patient would like to avoid dialysis but was okay with it if needed.  Pain control - [place]  Controlled Substance Reporting System database was reviewed. and patient was instructed, not to drive, operate heavy machinery, perform activities at heights, swimming or participation in water activities or provide baby-sitting services while on Pain, Sleep and Anxiety Medications; until their outpatient Physician has advised to do so again. Also recommended to not to take more than prescribed Pain, Sleep and Anxiety Medications.  Consultants: Nephrology Procedures performed: None Disposition: Home Diet recommendation:  Discharge Diet Orders (From admission, onward)     Start     Ordered   01/22/24 0000  Diet - low sodium heart healthy        01/22/24 1147           Renal diet DISCHARGE MEDICATION: Allergies as of 01/22/2024   No Known Allergies      Medication List     STOP taking these medications    gabapentin  100 MG capsule Commonly known as: NEURONTIN        TAKE these medications    albuterol  108 (90 Base) MCG/ACT inhaler Commonly known as: VENTOLIN  HFA TAKE 2 PUFFS BY MOUTH EVERY 6 HOURS AS NEEDED FOR WHEEZE OR SHORTNESS OF BREATH   amLODipine  5 MG tablet Commonly known as: NORVASC  Take 1 tab twice daily   cyclobenzaprine 5 MG tablet Commonly known as: FLEXERIL Take 5 mg by mouth at bedtime as needed.   feeding supplement (NEPRO CARB STEADY) Liqd Take 237 mLs by mouth 3 (three) times daily as  needed (Supplement).   oxyCODONE -acetaminophen  10-325 MG tablet Commonly known as: PERCOCET Take 1 tablet by mouth every 4 (four) hours as needed for pain (takes five a day).   rosuvastatin  40 MG tablet Commonly known as: CRESTOR  Take 1 tablet (40 mg total) by mouth daily.   sodium bicarbonate  650 MG tablet Take 1 tablet (650 mg total) by mouth daily. Start taking on: January 23, 2024        Follow-up Information      Dennise Capri, MD. Schedule an appointment as soon as possible for a visit in 1 week(s).   Specialty: Nephrology Contact information: [address] [place] [address] [phone]         Ostwalt, Janna, PA-C. Schedule an appointment as soon as possible for a visit in 1 week(s).   Specialty: Physician Assistant Contact information: [address] [place] [address] [phone]                Discharge Exam: Fredricka Weights   01/20/24 0404 01/21/24 0327 01/22/24 0733  Weight: 80.9 kg 81 kg 81.4 kg   General.  Frail elderly man, in no acute distress. Pulmonary.  Lungs clear bilaterally, normal respiratory effort. CV.  Regular rate and rhythm, no JVD, rub or murmur. Abdomen.  Soft, nontender, nondistended, BS positive. CNS.  Alert and oriented .  No focal neurologic deficit. Extremities.  1-2+ edema involving all extremities. Psychiatry.  Judgment and insight appears normal.   Condition at discharge: stable  The results of significant diagnostics from this hospitalization (including imaging, microbiology, ancillary and laboratory) are listed below for reference.   Imaging Studies: US  ABDOMEN LIMITED RUQ (LIVER/GB) Result Date: 01/16/2024 CLINICAL DATA:  Transaminitis EXAM: ULTRASOUND ABDOMEN LIMITED RIGHT UPPER QUADRANT COMPARISON:  None Available. FINDINGS: Gallbladder: No gallstones or wall thickening visualized. No sonographic Murphy sign noted by sonographer. Common bile duct: Diameter: Normal caliber, 6 mm Liver: No focal lesion identified. Within normal limits in parenchymal echogenicity. Portal vein is patent on color Doppler imaging with normal direction of blood flow towards the liver. Other: None. IMPRESSION: No acute findings. Electronically Signed   By: Franky Crease M.D.   On: 01/16/2024 23:31   US  Renal Result Date: 01/16/2024 CLINICAL DATA:  Acute renal failure EXAM: RENAL / URINARY TRACT ULTRASOUND COMPLETE COMPARISON:   None Available. FINDINGS: Right Kidney: Renal measurements: 2.2 x 4.754.7 cm = volume: 116 mL. Cortical thinning. Increased echotexture. No mass or hydronephrosis. Left Kidney: Renal measurements: 10.4 x 5.7 x 5.7 cm = volume: 177 mL. Diffusely increased echotexture. No mass or hydronephrosis. Bladder: Appears normal for degree of bladder distention. Other: None. IMPRESSION: No acute findings.  No hydronephrosis. Increased echotexture compatible with chronic medical renal disease. Electronically Signed   By: Franky Crease M.D.   On: 01/16/2024 23:30    Microbiology: Results for orders placed or performed during the hospital encounter of 01/16/24  Resp panel by RT-PCR (RSV, Flu A&B, Covid) Anterior Nasal Swab     Status: None   Collection Time: 01/16/24 11:32 PM   Specimen: Anterior Nasal Swab  Result Value Ref Range Status   SARS Coronavirus 2 by RT PCR NEGATIVE NEGATIVE Final    Comment: (NOTE) SARS-CoV-2 target nucleic acids are NOT DETECTED.  The SARS-CoV-2 RNA is generally detectable in upper respiratory specimens during the acute phase of infection. The lowest concentration of SARS-CoV-2 viral copies this assay can detect is 138 copies/mL. A negative result does not preclude SARS-Cov-2 infection and should not be  used as the sole basis for treatment or other patient management decisions. A negative result may occur with  improper specimen collection/handling, submission of specimen other than nasopharyngeal swab, presence of viral mutation(s) within the areas targeted by this assay, and inadequate number of viral copies(<138 copies/mL). A negative result must be combined with clinical observations, patient history, and epidemiological information. The expected result is Negative.  Fact Sheet for Patients:  BloggerCourse.com  Fact Sheet for Healthcare Providers:  SeriousBroker.it  This test is no t yet approved or cleared by the  [place]  FDA and  has been authorized for detection and/or diagnosis of SARS-CoV-2 by FDA under an Emergency Use Authorization (EUA). This EUA will remain  in effect (meaning this test can be used) for the duration of the COVID-19 declaration under Section 564(b)(1) of the Act, 21 U.S.C.section 360bbb-3(b)(1), unless the authorization is terminated  or revoked sooner.       Influenza A by PCR NEGATIVE NEGATIVE Final   Influenza B by PCR NEGATIVE NEGATIVE Final    Comment: (NOTE) The Xpert Xpress SARS-CoV-2/FLU/RSV plus assay is intended as an aid in the diagnosis of influenza from Nasopharyngeal swab specimens and should not be used as a sole basis for treatment. Nasal washings and aspirates are unacceptable for Xpert Xpress SARS-CoV-2/FLU/RSV testing.  Fact Sheet for Patients: BloggerCourse.com  Fact Sheet for Healthcare Providers: SeriousBroker.it  This test is not yet approved or cleared by the [place]  FDA and has been authorized for detection and/or diagnosis of SARS-CoV-2 by FDA under an Emergency Use Authorization (EUA). This EUA will remain in effect (meaning this test can be used) for the duration of the COVID-19 declaration under Section 564(b)(1) of the Act, 21 U.S.C. section 360bbb-3(b)(1), unless the authorization is terminated or revoked.     Resp Syncytial Virus by PCR NEGATIVE NEGATIVE Final    Comment: (NOTE) Fact Sheet for Patients: BloggerCourse.com  Fact Sheet for Healthcare Providers: SeriousBroker.it  This test is not yet approved or cleared by the [place]  FDA and has been authorized for detection and/or diagnosis of SARS-CoV-2 by FDA under an Emergency Use Authorization (EUA). This EUA will remain in effect (meaning this test can be used) for the duration of the COVID-19 declaration under Section 564(b)(1) of the Act, 21 U.S.C. section  360bbb-3(b)(1), unless the authorization is terminated or revoked.  Performed at [facility], [address] [place], [place] [address]     Labs: CBC: Recent Labs  Lab 01/16/24 2210 01/17/24 0406 01/18/24 0350 01/19/24 0618  WBC 14.1* 12.0* 12.2* 8.9  HGB 10.6* 9.8* 9.5* 9.8*  HCT 31.4* 29.8* 28.8* 30.3*  MCV 94.6 96.8 96.3 97.7  PLT 378 331 295 311   Basic Metabolic Panel: Recent Labs  Lab 01/17/24 0406 01/18/24 0350 01/19/24 0618 01/20/24 0414 01/21/24 0502 01/22/24 1017  NA 136 137 137 138 138 139  K 3.6 3.7 3.4* 3.8 3.6 3.5  CL 107 108 105 106 107 105  CO2 19* 20* 20* 21* 20* 25  GLUCOSE 118* 107* 124* 96 102* 104*  BUN 76* 64* 57* 59* 56* 52*  CREATININE 5.71* 4.98* 4.68* 4.56* 4.06* 3.76*  CALCIUM  8.2* 8.1* 8.3* 8.4* 8.1* 8.2*  PHOS 3.8  --   --   --  3.4 3.3   Liver Function Tests: Recent Labs  Lab 01/16/24 2210 01/17/24 0406 01/17/24 0805 01/18/24 0350 01/19/24 0618 01/20/24 0414 01/21/24 0502 01/22/24 1017  AST 273*  --  222* 199* 143* 119*  --   --  ALT 124*  --  98* 96* 87* 82*  --   --   ALKPHOS 60  --  44 46 43 45  --   --   BILITOT 0.7  --  0.5 0.6 0.7 0.7  --   --   PROT 7.0  --  5.4* 5.6* 5.9* 5.9*  --   --   ALBUMIN 2.9*   < > 2.1* 2.1* 2.4* 2.2* 2.1* 2.3*   < > = values in this interval not displayed.   CBG: No results for input(s): GLUCAP in the last 168 hours.  Discharge time spent: greater than 30 minutes.  This record has been created using Conservation officer, historic buildings. Errors have been sought and corrected,but may not always be located. Such creation errors do not reflect on the standard of care.   Signed: Amaryllis Dare, MD [facility] 01/22/2024

## 2024-01-24 ENCOUNTER — Telehealth: Payer: Self-pay

## 2024-01-24 NOTE — Transitions of Care (Post Inpatient/ED Visit) (Signed)
   01/24/2024  Name: Jerry Galloway MRN: [id number] DOB: [birth year]  Today's TOC FU Call Status: Today's TOC FU Call Status:: Unsuccessful Call (1st Attempt) Unsuccessful Call (1st Attempt) Date: 01/24/24  Attempted to reach the patient regarding the most recent Inpatient/ED visit.  Unable to leave a voicemail message on phone number listed in DPR for detailed messages.  Follow Up Plan: Additional outreach attempts will be made to reach the patient to complete the Transitions of Care (Post Inpatient/ED visit) call.   Richerd Fish, RN, BSN, CCM [facility], [facility] RN Care Manager Direct Dial: [phone]

## 2024-01-25 ENCOUNTER — Telehealth: Payer: Self-pay

## 2024-01-25 NOTE — Transitions of Care (Post Inpatient/ED Visit) (Signed)
   01/25/2024  Name: Jerry Galloway MRN: [id number] DOB: 1950/10/31  Today's TOC FU Call Status: Today's TOC FU Call Status:: Unsuccessful Call (2nd Attempt) Unsuccessful Call (2nd Attempt) Date: 01/25/24  Attempted to reach the patient regarding the most recent Inpatient/ED visit.  Follow Up Plan: Additional outreach attempts will be made to reach the patient to complete the Transitions of Care (Post Inpatient/ED visit) call.   Alan Ee, RN, BSN, CEN [employer]- Transition of Care Team.  [facility] [phone]

## 2024-01-26 ENCOUNTER — Ambulatory Visit (INDEPENDENT_AMBULATORY_CARE_PROVIDER_SITE_OTHER): Admitting: Physician Assistant

## 2024-01-26 ENCOUNTER — Encounter: Payer: Self-pay | Admitting: Physician Assistant

## 2024-01-26 ENCOUNTER — Telehealth: Payer: Self-pay

## 2024-01-26 VITALS — BP 124/62 | HR 82 | Resp 16 | Ht 72.0 in | Wt 181.3 lb

## 2024-01-26 DIAGNOSIS — Z09 Encounter for follow-up examination after completed treatment for conditions other than malignant neoplasm: Secondary | ICD-10-CM

## 2024-01-26 DIAGNOSIS — J449 Chronic obstructive pulmonary disease, unspecified: Secondary | ICD-10-CM

## 2024-01-26 DIAGNOSIS — J3089 Other allergic rhinitis: Secondary | ICD-10-CM

## 2024-01-26 DIAGNOSIS — Z720 Tobacco use: Secondary | ICD-10-CM | POA: Diagnosis not present

## 2024-01-26 DIAGNOSIS — H918X3 Other specified hearing loss, bilateral: Secondary | ICD-10-CM

## 2024-01-26 DIAGNOSIS — N186 End stage renal disease: Secondary | ICD-10-CM

## 2024-01-26 MED ORDER — ALBUTEROL SULFATE HFA 108 (90 BASE) MCG/ACT IN AERS: INHALATION_SPRAY | RESPIRATORY_TRACT | 1 refills | Status: AC

## 2024-01-26 NOTE — Transitions of Care (Post Inpatient/ED Visit) (Signed)
   01/26/2024  Name: EWEL LONA MRN: [id number] DOB: 17-Nov-1950  Today's TOC FU Call Status: Today's TOC FU Call Status:: Unsuccessful Call (3rd Attempt) Unsuccessful Call (3rd Attempt) Date: 01/26/24  Attempted to reach the patient regarding the most recent Inpatient/ED visit.  Follow Up Plan: No further outreach attempts will be made at this time. We have been unable to contact the patient.  Shona Prow RN, CCM [place]  [facility] RN Care Manager [phone]

## 2024-01-26 NOTE — Progress Notes (Signed)
 Established patient visit  Patient: Jerry Galloway   DOB: 03-Sep-1950   73 y.o. Male  MRN: [id number] Visit Date: 01/26/2024  Today's healthcare provider: Jolynn Spencer, PA-C   Chief Complaint  Patient presents with   Hospitalization Follow-up    Seen at ER 01/16/24 - Discharge instructions: f/u with PCP obtain CBC and Renal function panel.  Pt reports feeling better, having bilateral foot swelling, L hand swelling.    Subjective     HPI     Hospitalization Follow-up    Additional comments: Seen at ER 01/16/24 - Discharge instructions: f/u with PCP obtain CBC and Renal function panel.  Pt reports feeling better, having bilateral foot swelling, L hand swelling.       Last edited by Wilfred Hargis RAMAN, CMA on 01/26/2024  2:13 PM.       Discussed the use of AI scribe software for clinical note transcription with the patient, who gave verbal consent to proceed.  History of Present Illness Jerry Galloway is a 73 year old male who presents with swelling in his feet and arms. He is accompanied by his son.  He was hospitalized for over a week and has experienced swelling in his feet and arms since discharge. Initially, both arms swelled significantly due to an IV, but the swelling has decreased. His feet began to swell after the IV was removed. There is no pain associated with the swelling.  He has significantly reduced smoking, having abstained for over two weeks while in the hospital. He uses inhalers and was provided with smoking cessation aids during his hospital stay.  He experiences post-nasal drainage and allergies, for which he takes antihistamines and uses nasal saline spray and Flonase. He reports congestion, particularly during allergy seasons in [place] .  Current medications include albuterol , amlodipine , and rosuvastatin . Sodium bicarbonate  was discontinued during his hospital stay. There is no difficulty with urination.   Follow up  Hospitalization  Patient was admitted to [facility] on 01/16/24 and discharged on 01/22/24. He was treated for aki. Treatment for this included iv fluids. Telephone follow up was done on 10/20, 01/25/24/unsuccessful He reports fair compliance with treatment. He reports this condition is improved.  ----------------------------------------------------------------------------------------- -     09/27/2023   10:28 AM 03/13/2022    3:47 PM 11/17/2021    9:17 AM  Depression screen PHQ 2/9  Decreased Interest 0 2 0  Down, Depressed, Hopeless 0 2 0  PHQ - 2 Score 0 4 0  Altered sleeping 0 1   Tired, decreased energy 0 2   Change in appetite 0 0   Feeling bad or failure about yourself  0 0   Trouble concentrating 0 0   Moving slowly or fidgety/restless 0 0   Suicidal thoughts 0 0   PHQ-9 Score 0 7   Difficult doing work/chores Not difficult at all Not difficult at all       09/27/2023   10:29 AM 03/13/2022    3:47 PM  GAD 7 : Generalized Anxiety Score  Nervous, Anxious, on Edge 0 1  Control/stop worrying 0 0  Worry too much - different things 0 0  Trouble relaxing 0   Restless 0 0  Easily annoyed or irritable 0 0  Afraid - awful might happen 0 0  Total GAD 7 Score 0   Anxiety Difficulty Not difficult at all     Medications: Outpatient Medications Prior to Visit  Medication Sig   amLODipine  (NORVASC ) 5 MG tablet Take 1 tab  twice daily   cyclobenzaprine (FLEXERIL) 5 MG tablet Take 5 mg by mouth at bedtime as needed.   Nutritional Supplements (FEEDING SUPPLEMENT, NEPRO CARB STEADY,) LIQD Take 237 mLs by mouth 3 (three) times daily as needed (Supplement).   oxyCODONE -acetaminophen  (PERCOCET) 10-325 MG tablet Take 1 tablet by mouth every 4 (four) hours as needed for pain (takes five a day).   rosuvastatin  (CRESTOR ) 40 MG tablet Take 1 tablet (40 mg total) by mouth daily.   sodium bicarbonate  650 MG tablet Take 1 tablet (650 mg total) by mouth daily.   [DISCONTINUED] albuterol  (VENTOLIN   HFA) 108 (90 Base) MCG/ACT inhaler TAKE 2 PUFFS BY MOUTH EVERY 6 HOURS AS NEEDED FOR WHEEZE OR SHORTNESS OF BREATH   No facility-administered medications prior to visit.    Review of Systems  All other systems reviewed and are negative.  All negative Except see HPI       Objective    BP 124/62   Pulse 82   Resp 16   Ht 6' (1.829 m)   Wt 181 lb 4.8 oz (82.2 kg)   SpO2 99%   BMI 24.59 kg/m     Physical Exam Vitals reviewed.  Constitutional:      General: He is not in acute distress.    Appearance: Normal appearance. He is not diaphoretic.  HENT:     Head: Normocephalic and atraumatic.  Eyes:     General: No scleral icterus.    Conjunctiva/sclera: Conjunctivae normal.  Cardiovascular:     Rate and Rhythm: Normal rate and regular rhythm.     Pulses: Normal pulses.     Heart sounds: Normal heart sounds. No murmur heard. Pulmonary:     Effort: Pulmonary effort is normal. No respiratory distress.     Breath sounds: Normal breath sounds. No wheezing or rhonchi.  Musculoskeletal:     Cervical back: Neck supple.     Right lower leg: No edema.     Left lower leg: No edema.  Lymphadenopathy:     Cervical: No cervical adenopathy.  Skin:    General: Skin is warm and dry.     Findings: No rash.  Neurological:     Mental Status: He is alert and oriented to person, place, and time. Mental status is at baseline.  Psychiatric:        Mood and Affect: Mood normal.        Behavior: Behavior normal.      No results found for any visits on 01/26/24.      Assessment & Plan Hospital discharge follow-up Acute on chronic Kidney failure DDD Hx of drug overdose Long-term current use of BX Dyslipidemia Hypertension RA Chronic pain syndrome CKD Tobacco use COPD LFT elevation  Chronic kidney disease with lower extremity edema Chronic kidney disease with persistent edema and declining GFR. Risk of heart failure due to fluid overload. - Refer to nephrology for  evaluation and management. - Advise fluid intake of 1 to 1.5 liters daily, monitor for thirst and swelling. - Recommend compression stockings for edema. - Order blood work to assess kidney function. - Advise contacting nephrology for possible earlier appointment due to urgency.  Chronic obstructive pulmonary disease (COPD) COPD managed with inhalers. Smoking cessation efforts ongoing. - Continue albuterol  inhaler as prescribed. - Encourage smoking cessation efforts and provide support.  Allergic rhinitis Allergic rhinitis with significant post-nasal drainage and congestion. - Continue nasal saline spray and Flonase. - Continue antihistamines such as Allegra, Claritin, or Zyrtec.  Hearing loss Hearing  loss with recommendation for audiology evaluation. - Refer to audiology for hearing evaluation and management.  Chronic obstructive pulmonary disease, unspecified COPD type (HCC)  - albuterol  (VENTOLIN  HFA) 108 (90 Base) MCG/ACT inhaler; TAKE 2 PUFFS BY MOUTH EVERY 6 HOURS AS NEEDED FOR WHEEZE OR SHORTNESS OF BREATH  Dispense: 6.7 each; Refill: 1  Tobacco use Chronic and stable Cessation advised Will follow-up  Other specified hearing loss of both ears  - Ambulatory referral to Audiology  ESRD (end stage renal disease) (HCC)  - Basic metabolic panel with GFR - CBC with Differential/Platelet   Orders Placed This Encounter  Procedures   Basic metabolic panel with GFR   CBC with Differential/Platelet   Ambulatory referral to ENT    Referral Priority:   Routine    Referral Type:   Consultation    Referral Reason:   Specialty Services Required    Requested Specialty:   Otolaryngology    Number of Visits Requested:   1    No follow-ups on file.   The patient was advised to call back or seek an in-person evaluation if the symptoms worsen or if the condition fails to improve as anticipated.  I discussed the assessment and treatment plan with the patient. The patient was  provided an opportunity to ask questions and all were answered. The patient agreed with the plan and demonstrated an understanding of the instructions.  I, Virginie Josten, PA-C have reviewed all documentation for this visit. The documentation on 01/26/2024  for the exam, diagnosis, procedures, and orders are all accurate and complete.  Jolynn Spencer, [facility], MMS [facility] [phone] (phone) [phone] (fax)  [facility]

## 2024-01-27 LAB — CBC WITH DIFFERENTIAL/PLATELET
Basophils Absolute: 0 x10E3/uL (ref 0.0–0.2)
Basos: 0 %
EOS (ABSOLUTE): 0.3 x10E3/uL (ref 0.0–0.4)
Eos: 4 %
Hematocrit: 30.5 % — ABNORMAL LOW (ref 37.5–51.0)
Hemoglobin: 9.9 g/dL — ABNORMAL LOW (ref 13.0–17.7)
Immature Grans (Abs): 0 x10E3/uL (ref 0.0–0.1)
Immature Granulocytes: 0 %
Lymphocytes Absolute: 2.6 x10E3/uL (ref 0.7–3.1)
Lymphs: 33 %
MCH: 32.6 pg (ref 26.6–33.0)
MCHC: 32.5 g/dL (ref 31.5–35.7)
MCV: 100 fL — ABNORMAL HIGH (ref 79–97)
Monocytes Absolute: 0.5 x10E3/uL (ref 0.1–0.9)
Monocytes: 6 %
Neutrophils Absolute: 4.4 x10E3/uL (ref 1.4–7.0)
Neutrophils: 57 %
Platelets: 344 x10E3/uL (ref 150–450)
RBC: 3.04 x10E6/uL — ABNORMAL LOW (ref 4.14–5.80)
RDW: 12.9 % (ref 11.6–15.4)
WBC: 7.9 x10E3/uL (ref 3.4–10.8)

## 2024-01-27 LAB — BASIC METABOLIC PANEL WITH GFR
BUN/Creatinine Ratio: 9 — ABNORMAL LOW (ref 10–24)
BUN: 31 mg/dL — ABNORMAL HIGH (ref 8–27)
CO2: 21 mmol/L (ref 20–29)
Calcium: 8.6 mg/dL (ref 8.6–10.2)
Chloride: 104 mmol/L (ref 96–106)
Creatinine, Ser: 3.37 mg/dL — ABNORMAL HIGH (ref 0.76–1.27)
Glucose: 98 mg/dL (ref 70–99)
Potassium: 5.1 mmol/L (ref 3.5–5.2)
Sodium: 140 mmol/L (ref 134–144)
eGFR: 18 mL/min/1.73 — ABNORMAL LOW (ref >59)

## 2024-01-31 ENCOUNTER — Ambulatory Visit: Payer: Self-pay | Admitting: Physician Assistant

## 2024-01-31 NOTE — Progress Notes (Signed)
 Improved, still has to follow-up with nephrology. If symptoms persist, will consider a referral to hematology

## 2024-02-08 ENCOUNTER — Other Ambulatory Visit: Payer: Self-pay | Admitting: Physician Assistant

## 2024-02-08 DIAGNOSIS — E785 Hyperlipidemia, unspecified: Secondary | ICD-10-CM

## 2024-02-08 NOTE — Telephone Encounter (Unsigned)
 Copied from CRM [id number]. Topic: Clinical - Medication Refill >> Feb 08, 2024  5:16 PM Delon T wrote: Medication: rosuvastatin  (CRESTOR ) 40 MG tablet  Has the patient contacted their pharmacy? No (Agent: If no, request that the patient contact the pharmacy for the refill. If patient does not wish to contact the pharmacy document the reason why and proceed with request.) (Agent: If yes, when and what did the pharmacy advise?)  This is the patient's preferred pharmacy:    [id number] [place], [place] - [address] AT [facility] OF [address] [place] [address] [place] [address] Phone: [phone] Fax: [phone]  Is this the correct pharmacy for this prescription? Yes If no, delete pharmacy and type the correct one.   Has the prescription been filled recently? Yes  Is the patient out of the medication? No  Has the patient been seen for an appointment in the last year OR does the patient have an upcoming appointment? Yes  Can we respond through MyChart? No  Agent: Please be advised that Rx refills may take up to 3 business days. We ask that you follow-up with your pharmacy.

## 2024-02-09 NOTE — Progress Notes (Signed)
 [facility] New Consult Visit  Patient Name: Jerry Galloway, male   Patient DOB: [birth year] Date of Service: 02/09/2024  Patient MRN: [id number] Provider Creating Note: Pinkey Edman, MD  [phone] Primary Care Physician: Myrla Jon HERO, MD  [address] [place] [address] Additional Physicians/ Providers:    History of Present Illness Lequan Dobratz is a 73 y.o. male with past medical history of hypertension, coronary artery disease, restless leg syndrome, history of chronic smoking with chronic obstructive pulmonary disease, hyperlipidemia and chronic kidney disease with anemia now comes for renal follow-up.  He had a history of chronic kidney disease and was admitted to [facility] for years ago and was dialyzed few times.  Since then he failed to follow-up with any nephrology physician. Recently he was admitted with history of intractable nausea and vomiting with weakness at [facility] and was found to have acute kidney injury. He had a renal sonogram which did not show any evidence of obstruction.  There was mild atrophy of the right kidney. He had severe metabolic acidosis for which he was started on sodium bicarbonate .  His bicarb improved to 25. His creatinine on discharge was 3.76 with a BUN of 52. Still smokes about a pack a day. Denies any use of NSAIDS.  Denies any chest pain, SOB or DOE. Denies any history of fever, chills or head aches. Denies any history of kidney stone disease or UTI.  Denies an history of urinary symptoms of U/F or Dysuria.    Medications   Current Outpatient Medications:  .  albuterol  HFA (PROVENTIL  HFA;VENTOLIN  HFA) 108 (90 Base) MCG/ACT inhaler, TAKE 2 PUFFS BY MOUTH EVERY 6 HOURS AS NEEDED FOR WHEEZE OR SHORTNESS OF BREATH, Disp: , Rfl:  .  amLODIPine  (NORVASC ) 5 MG tablet, Take 1 tab twice daily, Disp: , Rfl:  .  cyclobenzaprine (FLEXERIL) 5 MG tablet, Take 5 mg by mouth, Disp: , Rfl:  .   oxyCODONE -acetaminophen  (PERCOCET) 10-325 MG per tablet, Take 1 tablet by mouth, Disp: , Rfl:  .  rosuvastatin  (CRESTOR ) 40 MG tablet, Take 40 mg by mouth 1 (one) time each day, Disp: , Rfl:  .  sodium bicarbonate  650 MG tablet, Take 650 mg by mouth 1 (one) time each day, Disp: , Rfl:    Allergies Patient has no known allergies.  Problem List Patient Active Problem List  Diagnosis  . 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 Chronic obstructive pulmonary disease, not otherwise specified (HCC)  . Renal mass  . Anemia in chronic kidney disease  . Hypertension     Review of Systems ROS   History Past Medical History:  Diagnosis Date  . Acute kidney failure (HCC)   . Anemia   . Chronic airway obstruction, not elsewhere classified (HCC)   . Chronic kidney disease   . Essential hypertension   . Renal mass   . Rheumatoid arthritis (HCC)     History reviewed. No pertinent surgical history. History reviewed. No pertinent family history. Social History   Tobacco Use  . Smoking status: Not on file  . Smokeless tobacco: Not on file  Substance Use Topics  . Alcohol use: Not on file        Physical Exam  Vitals BP 123/82 (BP Location: Right upper arm, Patient Position: Sitting)   Pulse 92   Temp 97.8 F   Wt 171 lb (77.6 kg)   SpO2 96%   PHYSICAL EXAM: General appearance: well  developed, well nourished, NAD Eyes: anicteric sclerae, moist conjunctivae; no lid-lag  Neck: Trachea midline; supple Lungs: CTAB, with normal respiratory effort  CV: RRR, no MRGs  Abdomen: Soft, non-tender; bowel sounds present Extremities: No peripheral edema, cyanosis, or clubbing. Skin: Warm and dry, normal skin turgor, no rashes noted.   Laboratory Studies   Basic metabolic panel with GFR Component 01/26/24 <redacted file path> 01/22/24 <redacted file path> 01/21/24 <redacted file path> 01/20/24 <redacted file path> 01/19/24 <redacted file  path> 01/18/24 <redacted file path>  Glucose 98 -- -- -- -- --  BUN 31 High  52 <redacted file path> High  56 <redacted file path> High  59 <redacted file path> High  57 <redacted file path> High  64 <redacted file path> High   Creatinine, Ser 3.37 High  3.76 <redacted file path> High  4.06 <redacted file path> High  4.56 <redacted file path> High  4.68 <redacted file path> High  4.98 <redacted file path> High   eGFR 18 Low  -- -- -- -- --  BUN/Creatinine Ratio 9 Low  -- -- -- -- --  Sodium 140 139 <redacted file path> 138 <redacted file path> 138 <redacted file path> 137 <redacted file path> 137 <redacted file path>  Potassium 5.1 3.5 <redacted file path> 3.6 <redacted file path> 3.8 <redacted file path> 3.4 <redacted file path> Low  3.7 <redacted file path>  Chloride 104 105 <redacted file path> 107 <redacted file path> 106 <redacted file path> 105 <redacted file path> 108 <redacted file path>  CO2 21 25 <redacted file path> 20 <redacted file path> Low  21 <redacted file path> Low  20 <redacted file path> Low  20 <redacted file path> Low   Calcium  8.6 8.2 <redacted file path> Low  8.1 <redacted file path> Low  8.4 <redacted file path> Low  8.3 <redacted file path> Low  8.1 <redacted file path> Low    CBC with Differential/Platelet Component 01/26/24 <redacted file path> 01/19/24 <redacted file path> 01/18/24 <redacted file path> 01/17/24 <redacted file path> 01/16/24 <redacted file path> 09/27/23 <redacted file path>  WBC 7.9 8.9 <redacted file path> 12.2 <redacted file path> High  12.0 <redacted file path> High  14.1 <redacted file path> High  6.8 <redacted file path>  RBC 3.04 Low  3.10 <redacted file path> Low  2.99 <redacted file path> Low  3.08 <redacted file path> Low  3.32 <redacted file path> Low  4.42 <redacted file path>  Hemoglobin 9.9 Low  9.8 <redacted file path> Low  9.5 <redacted file path> Low  9.8 <redacted file path> Low  10.6 <redacted file path> Low  13.8 <redacted file path>   Hematocrit 30.5 Low  -- -- -- -- 44.3 <redacted file path>  MCV 100 High  97.7 <redacted file path> 96.3 <redacted file path> 96.8 <redacted file path> 94.6 <redacted file path> 100 <redacted file path> High   MCH 32.6 31.6 <redacted file path> 31.8 <redacted file path> 31.8 <redacted file path> 31.9 <redacted file path> 31.2 <redacted file path>  MCHC 32.5 32.3 <redacted file path> 33.0 <redacted file path> 32.9 <redacted file path> 33.8 <redacted file path> 31.2 <redacted file path> Low   RDW 12.9 13.9 <redacted file path> 13.7 <redacted file path> 13.7 <redacted file path> 13.7 <redacted file path> 13.6 <redacted file path>  Platelets 344 311 <redacted file path> 295 <redacted file path> 331 <redacted file path> 378 <redacted file path> 31 <redacted file path>   Urinalysis, Routine w reflex microscopic -Urine, Clean Catch Component 01/16/24 <redacted file path> 02/18/20 <redacted  file path>  Color, Urine YELLOW Abnormal  YELLOW <redacted file path> Abnormal   APPearance HAZY Abnormal  CLEAR <redacted file path> Abnormal   Specific Gravity, Urine 1.016 1.008 <redacted file path>  pH 5.0 5.0 <redacted file path>  Glucose, UA NEGATIVE NEGATIVE <redacted file path>  Hgb urine dipstick LARGE Abnormal  SMALL <redacted file path> Abnormal   Bilirubin Urine NEGATIVE NEGATIVE <redacted file path>  Ketones, ur NEGATIVE NEGATIVE <redacted file path>  Protein, ur 100 Abnormal  NEGATIVE <redacted file path>  Nitrite NEGATIVE NEGATIVE <redacted file path>  Leukocytes,Ua NEGATIVE NEGATIVE <redacted file path>  RBC / HPF 0-5 0-5 <redacted file path>  WBC, UA 6-10 0-5 <redacted file path>  Bacteria, UA NONE SEEN NONE SEEN <redacted file path>   Imaging and Other Studies  RENAL / URINARY TRACT ULTRASOUND COMPLETE   COMPARISON:  None Available.   FINDINGS:  Right Kidney:   Renal measurements: 2.2 x 4.754.7 cm = volume: 116 mL. Cortical  thinning. Increased echotexture. No mass or hydronephrosis.    Left Kidney:   Renal measurements: 10.4 x 5.7 x 5.7 cm = volume: 177 mL. Diffusely  increased echotexture. No mass or hydronephrosis.   Bladder:   Appears normal for degree of bladder distention.   Other:   None.   IMPRESSION:  No acute findings.  No hydronephrosis.   Increased echotexture compatible with chronic medical renal disease.    Electronically Signed    By: Franky Crease M.D.    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 Renal Function Panel  . Uric Acid  . Urinalysis  . PTH, Intact  . Protein, Total, Random Urine w/Creatinine (Protein/Creat Ratio)  . CBC and Differential        Impression/Recommendations   French Kendra is a 73 y.o. male with past medical history of hypertension, coronary artery disease, restless leg syndrome, history of chronic smoking with chronic obstructive pulmonary disease, hyperlipidemia and chronic kidney disease with anemia now comes for renal follow-up.   He had a history of chronic kidney disease and was admitted to [facility] for years ago and was dialyzed few times.  Since then he failed to follow-up with any nephrology physician. Recently he was admitted with history of intractable nausea and vomiting with weakness at [facility] and was found to have acute kidney injury.   #1: Acute kidney injury on chronic kidney disease: Patient has a baseline creatinine of about 2.1 with a GFR of 32 cc/min.  His most recent creatinine was 3.37 with a GFR of 18 cc/min.  The etiology of acute kidney injury is probably  secondary to prerenal azotemia.  The renal sonogram showed atrophy of the right kidney.  Will probably need to repeat renal sonogram in the next few months.  Will obtain blood work today to assess renal function.  Patient does not want any dialysis interventions.  #2: Anemia: Patient may have anemia secondary to chronic kidney disease complicated by an deficiency.  I advised him  to continue the iron tablets orally.  #3: Metabolic acidosis: Patient was advised to continue the sodium bicarbonate  at 650 mg daily.  #4: Secondary hyperparathyroidism: We will check his PTH, calcium  and phosphorus levels today.  #5: Hypertension: Patient is advised on importance of 2 g salt restricted diet.  I advised him to continue the amlodipine  5 mg daily.  #6: Smoking/COPD: I advised him on importance of quitting smoking.  He is advised to avoid nonsteroidal anti-inflammatory drugs. Dietary advice given to the patient. I spoke to the patient and his son at bedside in detail and answered all their questions to their satisfaction. Will obtain some serologies pertinent to the patient's renal disease.   Prescription medications new and current medication dosages, including patient education, medication names, potential side effects, drug interactions, consequences of non compliance were discussed with the patient in detail. Patient expressed understanding.  Spoke to the patient in detail regarding the status of patient's renal function, answered all questions to the patient's satisfaction.  Dear Dr. Gasper, I Sincerely appreciate the opportunity to participate in the care of your very pleasant patient.  Please do not hesitate to contact me with any questions or concerns that may arise in regards to the patient's Nephrological care.   Return in about 5 weeks (around 03/15/2024).  Disclaimer: Much of the narrative of this dictation was acquired using speech recognition software. It is possible that some dictated  speech was not transcribed accurately by this system nor detected in the proofing. Such errors are at times unavoidable.    Pinkey Edman, MD [facility] Ph: [phone] Fax: [phone] 02/09/2024

## 2024-02-10 MED ORDER — ROSUVASTATIN CALCIUM 40 MG PO TABS: 40.0000 mg | ORAL_TABLET | Freq: Every day | ORAL | 1 refills | Status: AC

## 2024-02-10 NOTE — Telephone Encounter (Signed)
 Requested Prescriptions  Pending Prescriptions Disp Refills   rosuvastatin  (CRESTOR ) 40 MG tablet 90 tablet 1    Sig: Take 1 tablet (40 mg total) by mouth daily.     Cardiovascular:  Antilipid - Statins 2 Failed - 02/10/2024  1:40 PM      Failed - Cr in normal range and within 360 days    Creatinine, Ser  Date Value Ref Range Status  01/26/2024 3.37 (H) 0.76 - 1.27 mg/dL Final         Failed - Lipid Panel in normal range within the last 12 months    Cholesterol, Total  Date Value Ref Range Status  09/27/2023 171 100 - 199 mg/dL Final   LDL Chol Calc (NIH)  Date Value Ref Range Status  09/27/2023 100 (H) 0 - 99 mg/dL Final   HDL  Date Value Ref Range Status  09/27/2023 31 (L) >39 mg/dL Final   Triglycerides  Date Value Ref Range Status  09/27/2023 233 (H) 0 - 149 mg/dL Final         Passed - Patient is not pregnant      Passed - Valid encounter within last 12 months    Recent Outpatient Visits           2 weeks ago Hospital discharge follow-up   [facility] [place], [place], PA-C   3 months ago Dyslipidemia   [facility] [place], [place], PA-C   4 months ago Essential hypertension   [facility] [place], [place], PA-C

## 2024-02-25 ENCOUNTER — Telehealth: Payer: Self-pay | Admitting: Physician Assistant

## 2024-02-25 DIAGNOSIS — E785 Hyperlipidemia, unspecified: Secondary | ICD-10-CM

## 2024-02-25 NOTE — Telephone Encounter (Signed)
 Jerry Galloway stopped by to request a medication refill on Ezetimibe  10mg , pt stated Janna prescribed to him.

## 2024-02-28 NOTE — Telephone Encounter (Signed)
 Number is not in service, will attempt at a later time.

## 2024-02-28 NOTE — Telephone Encounter (Signed)
 Spoke with daughter and requested information pt Zetia  as it is not on med list not has been prescribed. The only medication pt is on for cholesterol is Rosuvastatin , daughter lenis) has been advised pt should have enough medication as it was recently filled 02/10/24 for 90-day supply and 1 refill that should last until May 2026. Daughter will speak with pt and return call to make sure that is the med he requested.

## 2024-02-29 ENCOUNTER — Other Ambulatory Visit: Payer: Self-pay | Admitting: Physician Assistant

## 2024-02-29 DIAGNOSIS — I10 Essential (primary) hypertension: Secondary | ICD-10-CM

## 2024-02-29 NOTE — Telephone Encounter (Unsigned)
 Copied from CRM [id number]. Topic: Clinical - Medication Refill >> Feb 29, 2024 12:40 PM Shardie S wrote: Medication: amLODipine  (NORVASC ) 5 MG tablet  Has the patient contacted their pharmacy? Yes (Agent: If no, request that the patient contact the pharmacy for the refill. If patient does not wish to contact the pharmacy document the reason why and proceed with request.) (Agent: If yes, when and what did the pharmacy advise?)  This is the patient's preferred pharmacy:    [id number] [place], [place] - [address] AT [facility] OF [address] [place] [address] [place] [address] Phone: [phone] Fax: [phone]  Is this the correct pharmacy for this prescription? Yes If no, delete pharmacy and type the correct one.   Has the prescription been filled recently? No  Is the patient out of the medication? Yes  Has the patient been seen for an appointment in the last year OR does the patient have an upcoming appointment? Yes  Can we respond through MyChart? No  Agent: Please be advised that Rx refills may take up to 3 business days. We ask that you follow-up with your pharmacy.

## 2024-03-01 ENCOUNTER — Other Ambulatory Visit: Payer: Self-pay | Admitting: Physician Assistant

## 2024-03-01 DIAGNOSIS — I10 Essential (primary) hypertension: Secondary | ICD-10-CM

## 2024-03-01 MED ORDER — AMLODIPINE BESYLATE 5 MG PO TABS: ORAL_TABLET | ORAL | 0 refills | Status: AC

## 2024-03-01 NOTE — Telephone Encounter (Signed)
 Requested Prescriptions  Pending Prescriptions Disp Refills   amLODipine  (NORVASC ) 5 MG tablet 90 tablet 0    Sig: Take 1 tab twice daily     Cardiovascular: Calcium  Channel Blockers 2 Passed - 03/01/2024  4:40 PM      Passed - Last BP in normal range    BP Readings from Last 1 Encounters:  01/26/24 124/62         Passed - Last Heart Rate in normal range    Pulse Readings from Last 1 Encounters:  01/26/24 82         Passed - Valid encounter within last 6 months    Recent Outpatient Visits           1 month ago Hospital discharge follow-up   [facility] [place], [place], PA-C   4 months ago Dyslipidemia   [facility] [place], [place], PA-C   5 months ago Essential hypertension   [facility] [place], [place], PA-C

## 2024-04-27 ENCOUNTER — Ambulatory Visit: Admitting: Physician Assistant
